# Patient Record
Sex: Male | Born: 1965
Health system: Southern US, Community
[De-identification: ages and names within clinical notes are randomized; demographics above are authoritative.]

## PROBLEM LIST (undated history)

## (undated) DIAGNOSIS — Q2381 Bicuspid aortic valve: Secondary | ICD-10-CM

## (undated) DIAGNOSIS — I10 Essential (primary) hypertension: Secondary | ICD-10-CM

## (undated) DIAGNOSIS — Q231 Congenital insufficiency of aortic valve: Secondary | ICD-10-CM

## (undated) DIAGNOSIS — I77810 Thoracic aortic ectasia: Secondary | ICD-10-CM

## (undated) HISTORY — DX: Bicuspid aortic valve: Q23.81

## (undated) HISTORY — DX: Thoracic aortic ectasia: I77.810

## (undated) HISTORY — DX: Essential (primary) hypertension: I10

## (undated) HISTORY — DX: Congenital insufficiency of aortic valve: Q23.1

---

## 2016-10-28 DIAGNOSIS — Z0001 Encounter for general adult medical examination with abnormal findings: Secondary | ICD-10-CM | POA: Diagnosis not present

## 2016-10-28 DIAGNOSIS — Z8042 Family history of malignant neoplasm of prostate: Secondary | ICD-10-CM | POA: Diagnosis not present

## 2016-10-28 DIAGNOSIS — I10 Essential (primary) hypertension: Secondary | ICD-10-CM | POA: Diagnosis not present

## 2016-10-29 ENCOUNTER — Other Ambulatory Visit: Payer: Self-pay | Admitting: Family Medicine

## 2016-10-29 ENCOUNTER — Telehealth (HOSPITAL_COMMUNITY): Payer: Self-pay | Admitting: Family Medicine

## 2016-10-29 DIAGNOSIS — Z8279 Family history of other congenital malformations, deformations and chromosomal abnormalities: Secondary | ICD-10-CM

## 2016-10-29 NOTE — Telephone Encounter (Signed)
10/29/2016 09:47 AM Phone ([place]) Lee Grant (Self) [phone] (H)   Left Message - Called pt and lmsg for him to CB to get sch for and echo.    By Verdene Rio

## 2016-11-12 ENCOUNTER — Ambulatory Visit (HOSPITAL_COMMUNITY): Payer: 59 | Attending: Cardiology

## 2016-11-12 ENCOUNTER — Other Ambulatory Visit: Payer: Self-pay

## 2016-11-12 DIAGNOSIS — Z8279 Family history of other congenital malformations, deformations and chromosomal abnormalities: Secondary | ICD-10-CM | POA: Diagnosis not present

## 2016-11-12 DIAGNOSIS — I77819 Aortic ectasia, unspecified site: Secondary | ICD-10-CM | POA: Diagnosis not present

## 2016-11-12 DIAGNOSIS — Q231 Congenital insufficiency of aortic valve: Secondary | ICD-10-CM | POA: Insufficient documentation

## 2016-12-14 DIAGNOSIS — I10 Essential (primary) hypertension: Secondary | ICD-10-CM | POA: Diagnosis not present

## 2016-12-14 DIAGNOSIS — Q231 Congenital insufficiency of aortic valve: Secondary | ICD-10-CM | POA: Diagnosis not present

## 2016-12-15 ENCOUNTER — Telehealth: Payer: Self-pay

## 2016-12-15 NOTE — Telephone Encounter (Signed)
SENT NOTES TO SCHEDULING 

## 2017-01-11 ENCOUNTER — Encounter: Payer: Self-pay | Admitting: *Deleted

## 2017-01-25 DIAGNOSIS — I77810 Thoracic aortic ectasia: Secondary | ICD-10-CM | POA: Insufficient documentation

## 2017-01-25 DIAGNOSIS — Q231 Congenital insufficiency of aortic valve: Secondary | ICD-10-CM | POA: Insufficient documentation

## 2017-01-25 NOTE — Progress Notes (Signed)
**Note De-Identified Grant Obfuscation** Cardiology Office Note    Date:  01/26/2017   ID:  Lee Grant, DOB [birth year], MRN [id number]  PCP:  Lee Kid, MD  Cardiologist: Lee Grooms, MD   Chief Complaint  Patient presents with  . Advice Only    Bicuspid aortic valve    History of Present Illness:  Lee Grant is a 51 y.o. male referred by Lee Grant for evaluation and management of bicuspid aortic valve.  The patient's older brother who is asymptomatic was recently diagnosed with bicuspid aortic valve. This led to an echocardiogram which identified bicuspid aortic valve and [name]. He denies chest pain, orthopnea, dyspnea on exertion, syncope, and exertional fatigue. He is relatively sedentary. He has no difficulty performing his typical activities.  Past Medical History:  Diagnosis Date  . Bicuspid aortic valve   . Hypertension     No past surgical history on file.  Current Medications: No outpatient prescriptions prior to visit.   No facility-administered medications prior to visit.      Allergies:   Patient has no known allergies.   Social History   Social History  . Marital status: Married    Spouse name: N/A  . Number of children: N/A  . Years of education: N/A   Social History Main Topics  . Smoking status: Former Research scientist (life sciences)  . Smokeless tobacco: Never Used  . Alcohol use None  . Drug use: Unknown  . Sexual activity: Not Asked   Other Topics Concern  . None   Social History Narrative  . None     Family History:  The patient's family history includes Aneurysm in his father and mother; Healthy in his sister; Hypotension in his mother; Other in his brother. Family history of bicuspid aortic valve, brother.  ROS:   Please see the history of present illness.    Snores at night. Previously observed by his wife. History of hypertension.  All other systems reviewed and are negative.   PHYSICAL EXAM:   VS:  BP 126/90 (BP Location: Right Arm)   Pulse 66    Ht 5\' 10"  (1.778 m)   Wt 224 lb (101.6 kg)   BMI 32.14 kg/m    GEN: Well nourished, well developed, in no acute distress  HEENT: normal  Neck: no JVD, carotid bruits, or masses Cardiac: RRR; Relatively low pitched 1/6 systolic murmur at right upper sternal border. No rubs, gallops, or edema.  Respiratory:  clear to auscultation bilaterally, normal work of breathing GI: soft, nontender, nondistended, + BS MS: no deformity or atrophy  Skin: warm and dry, no rash Neuro:  Alert and Oriented x 3, Strength and sensation are intact Psych: euthymic mood, full affect  Wt Readings from Last 3 Encounters:  01/26/17 224 lb (101.6 kg)      Studies/Labs Reviewed:   EKG:  EKG  Normal sinus rhythm with normal EKG tracing.  Recent Labs: No results found for requested labs within last 8760 hours.   Lipid Panel No results found for: CHOL, TRIG, HDL, CHOLHDL, VLDL, LDLCALC, LDLDIRECT  Additional studies/ records that were reviewed today include:   Echocardiogram May 2018: Study Conclusions  - Left ventricle: The cavity size was normal. There was mild focal   basal hypertrophy of the septum. Systolic function was vigorous.   The estimated ejection fraction was in the range of 65% to 70%.   Wall motion was normal; there were no regional wall motion   abnormalities. Left ventricular diastolic function parameters  were normal. - Aortic valve: Bicuspid; mildly thickened leaflets. Peak gradient   (S): 21 mm Hg. - Ascending aorta: The ascending aorta was mildly dilated measuring   42 mm. - Mitral valve: There was mild regurgitation. - Left atrium: The atrium was normal in size. - Right ventricle: Systolic function was normal. - Tricuspid valve: There was mild regurgitation. - Pulmonic valve: There was mild regurgitation. - Pulmonary arteries: Systolic pressure was within the normal   range. - Inferior vena cava: The vessel was normal in size. - Pericardium, extracardiac: There was no  pericardial effusion.  Impressions:  - Hyperdynamic LVEF 65-7-%.   Bicuspid aortic valve with mildly elevated transaortic gradients.   No AI.   Mildly dilated ascending aorta measuring 42 mm.   ASSESSMENT:    1. Bicuspid aortic valve   2. Dilated aortic root ([place])   3. Essential hypertension   4. Snoring      PLAN:  In order of problems listed above:  1. Essentially asymptomatic. Very slight systolic murmur is audible. Recently started on losartan 50 mg per day. In the future we should consider switching over to beta blocker therapy given the mild aortic root enlargement noted on the recent echo. This can be done next year. 2. Please see disc course above. Transition to beta blocker therapy for blood pressure control would be preferable to control/protect against root expansion. 3. Good blood pressure control today. Consider switch over to beta blocker therapy as tolerated by heart rate and side effects. 4. May need sleep apnea evaluation.  Greater than 50% of the time was spent in counseling and discussion of the natural history of bicuspid aortic valve, endocarditis prophylaxis, implications of rapid root expansion, and at least yearly follow-up.  We'll perform a 2-D Doppler echocardiogram in one year to establish stability.  May need to have evaluation for sleep apnea.  Medication Adjustments/Labs and Tests Ordered: Current medicines are reviewed at length with the patient today.  Concerns regarding medicines are outlined above.  Medication changes, Labs and Tests ordered today are listed in the Patient Instructions below. Patient Instructions  Medication Instructions:  None  Labwork: None  Testing/Procedures: Your physician has requested that you have an echocardiogram a few weeks prior to your 1 year follow up. Echocardiography is a painless test that uses sound waves to create images of your heart. It provides your doctor with information about the size and shape of  your heart and how well your heart's chambers and valves are working. This procedure takes approximately one hour. There are no restrictions for this procedure.    Follow-Up: Your physician wants you to follow-up in: 1 year with Dr. Tamala Julian.  You will receive a reminder letter in the mail two months in advance. If you don't receive a letter, please call our office to schedule the follow-up appointment.   Any Other Special Instructions Will Be Listed Below (If Applicable).     If you need a refill on your cardiac medications before your next appointment, please call your pharmacy.      Signed, Lee Grooms, MD  01/26/2017 1:23 PM    [facility] [address], [place], [place]  [address] Phone: [phone]; Fax: [phone]

## 2017-01-26 ENCOUNTER — Encounter (INDEPENDENT_AMBULATORY_CARE_PROVIDER_SITE_OTHER): Payer: Self-pay

## 2017-01-26 ENCOUNTER — Encounter: Payer: Self-pay | Admitting: Interventional Cardiology

## 2017-01-26 ENCOUNTER — Ambulatory Visit (INDEPENDENT_AMBULATORY_CARE_PROVIDER_SITE_OTHER): Payer: 59 | Admitting: Interventional Cardiology

## 2017-01-26 VITALS — BP 126/90 | HR 66 | Ht 70.0 in | Wt 224.0 lb

## 2017-01-26 DIAGNOSIS — Q231 Congenital insufficiency of aortic valve: Secondary | ICD-10-CM | POA: Diagnosis not present

## 2017-01-26 DIAGNOSIS — I77810 Thoracic aortic ectasia: Secondary | ICD-10-CM | POA: Diagnosis not present

## 2017-01-26 DIAGNOSIS — I10 Essential (primary) hypertension: Secondary | ICD-10-CM

## 2017-01-26 DIAGNOSIS — R0683 Snoring: Secondary | ICD-10-CM | POA: Diagnosis not present

## 2017-01-26 NOTE — Patient Instructions (Signed)
Medication Instructions:  None  Labwork: None  Testing/Procedures: Your physician has requested that you have an echocardiogram a few weeks prior to your 1 year follow up. Echocardiography is a painless test that uses sound waves to create images of your heart. It provides your doctor with information about the size and shape of your heart and how well your heart's chambers and valves are working. This procedure takes approximately one hour. There are no restrictions for this procedure.    Follow-Up: Your physician wants you to follow-up in: 1 year with Dr. Tamala Julian.  You will receive a reminder letter in the mail two months in advance. If you don't receive a letter, please call our office to schedule the follow-up appointment.   Any Other Special Instructions Will Be Listed Below (If Applicable).     If you need a refill on your cardiac medications before your next appointment, please call your pharmacy.

## 2017-11-01 DIAGNOSIS — Z8042 Family history of malignant neoplasm of prostate: Secondary | ICD-10-CM | POA: Diagnosis not present

## 2017-11-01 DIAGNOSIS — Z0000 Encounter for general adult medical examination without abnormal findings: Secondary | ICD-10-CM | POA: Diagnosis not present

## 2017-11-01 DIAGNOSIS — I10 Essential (primary) hypertension: Secondary | ICD-10-CM | POA: Diagnosis not present

## 2017-12-12 ENCOUNTER — Other Ambulatory Visit: Payer: Self-pay

## 2017-12-12 ENCOUNTER — Ambulatory Visit (HOSPITAL_COMMUNITY): Payer: 59 | Attending: Cardiovascular Disease

## 2017-12-12 DIAGNOSIS — Q2381 Bicuspid aortic valve: Secondary | ICD-10-CM

## 2017-12-12 DIAGNOSIS — Q231 Congenital insufficiency of aortic valve: Secondary | ICD-10-CM | POA: Diagnosis not present

## 2017-12-27 DIAGNOSIS — D126 Benign neoplasm of colon, unspecified: Secondary | ICD-10-CM | POA: Diagnosis not present

## 2017-12-27 DIAGNOSIS — K621 Rectal polyp: Secondary | ICD-10-CM | POA: Diagnosis not present

## 2017-12-27 DIAGNOSIS — Z1211 Encounter for screening for malignant neoplasm of colon: Secondary | ICD-10-CM | POA: Diagnosis not present

## 2018-01-23 ENCOUNTER — Other Ambulatory Visit (HOSPITAL_COMMUNITY): Payer: 59

## 2018-02-24 ENCOUNTER — Ambulatory Visit (INDEPENDENT_AMBULATORY_CARE_PROVIDER_SITE_OTHER): Payer: 59 | Admitting: Interventional Cardiology

## 2018-02-24 ENCOUNTER — Encounter: Payer: Self-pay | Admitting: Interventional Cardiology

## 2018-02-24 VITALS — BP 112/86 | HR 60 | Ht 69.5 in | Wt 229.2 lb

## 2018-02-24 DIAGNOSIS — R0683 Snoring: Secondary | ICD-10-CM

## 2018-02-24 DIAGNOSIS — I10 Essential (primary) hypertension: Secondary | ICD-10-CM | POA: Diagnosis not present

## 2018-02-24 DIAGNOSIS — I77810 Thoracic aortic ectasia: Secondary | ICD-10-CM | POA: Diagnosis not present

## 2018-02-24 DIAGNOSIS — Q231 Congenital insufficiency of aortic valve: Secondary | ICD-10-CM | POA: Diagnosis not present

## 2018-02-24 NOTE — Progress Notes (Signed)
Cardiology Office Note:    Date:  02/24/2018   ID:  Lee Grant, DOB Sep 27, 1965, MRN [id number]  PCP:  Lee Kid, MD  Cardiologist:  No primary care provider on file.   Referring MD: Lee Kid, MD   Chief Complaint  Patient presents with  . Thoracic Aortic Aneurysm  . Cardiac Valve Problem    History of Present Illness:    Lee Grant is a 52 y.o. male with a hx of essential hypertension, dilated aortic root, bicuspid aortic valve, with suspicion of bicuspid valve related aortopathy.  Asymptomatic, fully active, and no medication side effects.  His brother has a bicuspid aortic valve and aortopathy.  The brother is 41 years older.  Past Medical History:  Diagnosis Date  . Bicuspid aortic valve   . Hypertension     History reviewed. No pertinent surgical history.  Current Medications: Current Meds  Medication Sig  . losartan (COZAAR) 50 MG tablet Take 50 mg by mouth daily.     Allergies:   Patient has no known allergies.   Social History   Socioeconomic History  . Marital status: Married    Spouse name: Not on file  . Number of children: Not on file  . Years of education: Not on file  . Highest education level: Not on file  Occupational History  . Not on file  Social Needs  . Financial resource strain: Not on file  . Food insecurity:    Worry: Not on file    Inability: Not on file  . Transportation needs:    Medical: Not on file    Non-medical: Not on file  Tobacco Use  . Smoking status: Former Research scientist (life sciences)  . Smokeless tobacco: Never Used  Substance and Sexual Activity  . Alcohol use: Not on file  . Drug use: Not on file  . Sexual activity: Not on file  Lifestyle  . Physical activity:    Days per week: Not on file    Minutes per session: Not on file  . Stress: Not on file  Relationships  . Social connections:    Talks on phone: Not on file    Gets together: Not on file    Attends religious service: Not on file    Active member of club or  organization: Not on file    Attends meetings of clubs or organizations: Not on file    Relationship status: Not on file  Other Topics Concern  . Not on file  Social History Narrative  . Not on file     Family History: The patient's family history includes Aneurysm in his father and mother; Healthy in his sister; Hypotension in his mother; Other in his brother.  ROS:   Please see the history of present illness.    No All other systems reviewed and are negative.  EKGs/Labs/Other Studies Reviewed:    The following studies were reviewed today: 2D Doppler echocardiogram performed June 2019: Study Conclusions  - Left ventricle: The cavity size was normal. Systolic function was   normal. The estimated ejection fraction was in the range of 60%   to 65%. Wall motion was normal; there were no regional wall   motion abnormalities. - Aortic valve: A bicuspid morphology cannot be excluded;   moderately thickened, mildly calcified leaflets. There was mild   stenosis. - Left atrium: The atrium was mildly dilated. - Atrial septum: No defect or patent foramen ovale was identified. -Ascending aortic diameter 4.4 cm.  EKG:  EKG is  ordered today.  The ekg ordered today demonstrates this rhythm with normal appearance.  Poor R wave progression V1 to V2.  Recent Labs: No results found for requested labs within last 8760 hours.  Recent Lipid Panel No results found for: CHOL, TRIG, HDL, CHOLHDL, VLDL, LDLCALC, LDLDIRECT  Physical Exam:    VS:  BP 112/86   Pulse 60   Ht 5' 9.5" (1.765 m)   Wt 229 lb 3.2 oz (104 kg)   BMI 33.36 kg/m     Wt Readings from Last 3 Encounters:  02/24/18 229 lb 3.2 oz (104 kg)  01/26/17 224 lb (101.6 kg)     GEN: Mildly obese well developed in no acute distress HEENT: Normal NECK: No JVD. LYMPHATICS: No lymphadenopathy CARDIAC: RRR, over 6 systolic right upper sternal border aortic valve murmur, no gallop, no edema. VASCULAR: 2+ and symmetric radial and  posterior tibial pulses.  No bruits. RESPIRATORY:  Clear to auscultation without rales, wheezing or rhonchi  ABDOMEN: Soft, non-tender, non-distended, No pulsatile mass, MUSCULOSKELETAL: No deformity  SKIN: Warm and dry NEUROLOGIC:  Alert and oriented x 3 PSYCHIATRIC:  Normal affect   ASSESSMENT:    1. Bicuspid aortic valve   2. Dilated aortic root ([place])   3. Essential hypertension   4. Snoring    PLAN:    In order of problems listed above:  1. Soft systolic murmur but no regurgitation is heard.  Most recent echo suggests mild stenosis.  No regurgitation.  Suspected bicuspid etiology. 2. Aortic diameter 4.4 cm.  Prior to next office visit in 1 year he will have a CT Angie oh of the thoracic aorta with contrast to correctly/accurately size the aorta. 3. Current excellent blood pressure control.  Only current therapy is losartan 50 mg/day.  Relatively slow heart rate.  Have not added beta-blocker therapy.  Will wait at least 1 more year until we see CT Angio results.  Clinical follow-up in 1 year.  Clinic follow-up in 1 year.   Medication Adjustments/Labs and Tests Ordered: Current medicines are reviewed at length with the patient today.  Concerns regarding medicines are outlined above.  No orders of the defined types were placed in this encounter.  No orders of the defined types were placed in this encounter.   There are no Patient Instructions on file for this visit.   Signed, Sinclair Grooms, MD  02/24/2018 11:34 AM    [name]

## 2018-02-24 NOTE — Patient Instructions (Addendum)
Medication Instructions:  Your physician recommends that you continue on your current medications as directed. Please refer to the Current Medication list given to you today.  Labwork: None  Testing/Procedures: Your physician recommends that your a CT Angio of the Aorta just prior to seeing Dr. Tamala Julian back in one year. (Should be in late July or early August)  Follow-Up: Your physician wants you to follow-up in: 1 year with Dr. Tamala Julian.  You will receive a reminder letter in the mail two months in advance. If you don't receive a letter, please call our office to schedule the follow-up appointment.   Any Other Special Instructions Will Be Listed Below (If Applicable).     If you need a refill on your cardiac medications before your next appointment, please call your pharmacy.

## 2018-12-25 ENCOUNTER — Other Ambulatory Visit: Payer: Self-pay | Admitting: *Deleted

## 2018-12-25 DIAGNOSIS — I10 Essential (primary) hypertension: Secondary | ICD-10-CM

## 2019-01-29 ENCOUNTER — Other Ambulatory Visit: Payer: 59

## 2019-01-30 ENCOUNTER — Other Ambulatory Visit: Payer: Self-pay

## 2019-01-30 ENCOUNTER — Other Ambulatory Visit: Payer: 59

## 2019-01-30 DIAGNOSIS — I10 Essential (primary) hypertension: Secondary | ICD-10-CM

## 2019-01-30 LAB — BASIC METABOLIC PANEL
BUN/Creatinine Ratio: 13 (ref 9–20)
BUN: 13 mg/dL (ref 6–24)
CO2: 22 mmol/L (ref 20–29)
Calcium: 9.4 mg/dL (ref 8.7–10.2)
Chloride: 101 mmol/L (ref 96–106)
Creatinine, Ser: 1.01 mg/dL (ref 0.76–1.27)
GFR calc Af Amer: 99 mL/min/{1.73_m2} (ref >59)
GFR calc non Af Amer: 86 mL/min/{1.73_m2} (ref >59)
Glucose: 89 mg/dL (ref 65–99)
Potassium: 4.5 mmol/L (ref 3.5–5.2)
Sodium: 138 mmol/L (ref 134–144)

## 2019-02-02 ENCOUNTER — Telehealth: Payer: Self-pay | Admitting: Interventional Cardiology

## 2019-02-02 NOTE — Telephone Encounter (Signed)
Script Screening patients for COVID-19 and reviewing new operational procedures  Greeting - The reason I am calling is to share with you some new changes to our processes that are designed to help [place] keep everyone safe. Is now a good time to speak with you? Patient says "no' - ask them when you can call back and let them know it's important to do this prior to their appointment.  Patient says "yes" - Great, [place]) the first thing I need to do is ask you some screening Questions.  1. To the best of your knowledge, have you been in close contact with any one with a confirmed diagnosis of COVID 19? o No - proceed to next question  2. Have you had any one or more of the following: fever, chills, cough, shortness of breath or any flu-like symptoms? o No - proceed to next question  3. Have you been diagnosed with or have a previous diagnosis of COVID 19? o No - proceed to next question  4. I am going to go over a few other symptoms with you. Please let me know if you are experiencing any of the following: . Ear, nose or throat discomfort . A sore throat . Headache . Muscle pain . Diarrhea . Loss of taste or smell o No - proceed to next question  Thank you for answering these questions. Please know we will ask you these questions or similar questions when you arrive for your appointment and again it's how we are keeping everyone safe. Also, to keep you safe, please use the provided hand sanitizer when you enter the building. (Insert pt name), we are asking everyone in the building to wear a mask because they help [place] prevent the spread of germs. Do you have a mask of your own, if not, we are happy to provide one for you. The last thing I want to go over with you is the no visitor guidelines. This means no one can attend the appointment with you unless you need physical assistance. I understand this may be different from your past appointments and I know this may be difficult but  please know if someone is driving you we are happy to call them for you once your appointment is over.  [INSERT [place]  (Insert pt name) I've given you a lot of information, what questions do you have about what I've talked about today or your appointment tomorrow? Benjie Karvonen, CMA

## 2019-02-05 ENCOUNTER — Other Ambulatory Visit: Payer: Self-pay

## 2019-02-05 ENCOUNTER — Ambulatory Visit (INDEPENDENT_AMBULATORY_CARE_PROVIDER_SITE_OTHER)
Admission: RE | Admit: 2019-02-05 | Discharge: 2019-02-05 | Disposition: A | Discharge status: Home / Self Care | Payer: 59 | Source: Ambulatory Visit | Attending: Interventional Cardiology | Admitting: Interventional Cardiology

## 2019-02-05 DIAGNOSIS — I77810 Thoracic aortic ectasia: Secondary | ICD-10-CM | POA: Diagnosis not present

## 2019-02-05 DIAGNOSIS — Q231 Congenital insufficiency of aortic valve: Secondary | ICD-10-CM

## 2019-02-05 MED ORDER — IOHEXOL 350 MG/ML SOLN
100.0000 mL | Freq: Once | INTRAVENOUS | Status: AC | PRN
  Administered 2019-02-05: 100 mL via INTRAVENOUS

## 2019-04-22 NOTE — Progress Notes (Signed)
Cardiology Office Note:    Date:  04/24/2019   ID:  Oretha Milch, DOB 1966/06/25, MRN [id number]  PCP:  Kristen Loader, FNP  Cardiologist:  Sinclair Grooms, MD   Referring MD: Kristen Loader, FNP   Chief Complaint  Patient presents with  . Thoracic Aortic Aneurysm  . Cardiac Valve Problem    History of Present Illness:    Lee Grant is a 53 y.o. male with a hx of essential hypertension, dilated aortic root, bicuspid aortic valve, with suspicion of bicuspid valve related aortopathy.  Jahlani has no symptoms.  No medication side effects.  Physically and active.  Has gained some weight.  Denies chest pain.  Past Medical History:  Diagnosis Date  . Bicuspid aortic valve   . Hypertension     History reviewed. No pertinent surgical history.  Current Medications: Current Meds  Medication Sig  . losartan (COZAAR) 50 MG tablet Take 50 mg by mouth daily.     Allergies:   Patient has no known allergies.   Social History   Socioeconomic History  . Marital status: Married    Spouse name: Not on file  . Number of children: Not on file  . Years of education: Not on file  . Highest education level: Not on file  Occupational History  . Not on file  Social Needs  . Financial resource strain: Not on file  . Food insecurity    Worry: Not on file    Inability: Not on file  . Transportation needs    Medical: Not on file    Non-medical: Not on file  Tobacco Use  . Smoking status: Former Research scientist (life sciences)  . Smokeless tobacco: Never Used  Substance and Sexual Activity  . Alcohol use: Not on file  . Drug use: Not on file  . Sexual activity: Not on file  Lifestyle  . Physical activity    Days per week: Not on file    Minutes per session: Not on file  . Stress: Not on file  Relationships  . Social Herbalist on phone: Not on file    Gets together: Not on file    Attends religious service: Not on file    Active member of club or organization: Not on  file    Attends meetings of clubs or organizations: Not on file    Relationship status: Not on file  Other Topics Concern  . Not on file  Social History Narrative  . Not on file     Family History: The patient's family history includes Aneurysm in his father and mother; Healthy in his sister; Hypotension in his mother; Other in his brother.  ROS:   Please see the history of present illness.    No complaints all other systems reviewed and are negative.  EKGs/Labs/Other Studies Reviewed:    The following studies were reviewed today: ECHOCARDIOGRAM 2019: Study Conclusions  - Left ventricle: The cavity size was normal. Systolic function was   normal. The estimated ejection fraction was in the range of 60%   to 65%. Wall motion was normal; there were no regional wall   motion abnormalities. - Aortic valve: A bicuspid morphology cannot be excluded;   moderately thickened, mildly calcified leaflets. There was mild   stenosis. - Left atrium: The atrium was mildly dilated. - Atrial septum: No defect or patent foramen ovale was identified.  CT scan performed February 05, 2019:  IMPRESSION: 1. Ascending thoracic aortic diameter measures 4.4  x 4.4 cm. No evident dissection. Recommend annual imaging followup by CTA or MRA. This recommendation follows 2010 status for CT ACCF/AHA/AATS/ACR/ASA/SCA/SCAI/SIR/STS/SVM Guidelines for the Diagnosis and Management of Patients with Thoracic Aortic Disease. Circulation. 2010; [id number]. Aortic aneurysm NOS (ICD10-I71.9).  2.  No pulmonary embolus evident.  3.  No lung edema or consolidation.  4.  No demonstrable adenopathy.  5.  Hepatic steatosis.   EKG:  EKG demonstrates normal sinus rhythm, QS pattern V1 through V3, but is otherwise normal.  When compared to the prior tracing from February 24, 2018, no changes occurred.  Recent Labs: 01/30/2019: BUN 13; Creatinine, Ser 1.01; Potassium 4.5; Sodium 138  Recent Lipid Panel No  results found for: CHOL, TRIG, HDL, CHOLHDL, VLDL, LDLCALC, LDLDIRECT  Physical Exam:    VS:  BP (!) 149/96   Pulse 73   Ht 5' 9.5" (1.765 m)   Wt 235 lb (106.6 kg)   SpO2 98%   BMI 34.21 kg/m     Wt Readings from Last 3 Encounters:  04/24/19 235 lb (106.6 kg)  02/24/18 229 lb 3.2 oz (104 kg)  01/26/17 224 lb (101.6 kg)     GEN: Moderate obesity. No acute distress HEENT: Normal NECK: No JVD. LYMPHATICS: No lymphadenopathy CARDIAC:  RRR without murmur, gallop, or edema. VASCULAR:  Normal Pulses. No bruits. RESPIRATORY:  Clear to auscultation without rales, wheezing or rhonchi  ABDOMEN: Soft, non-tender, non-distended, No pulsatile mass, MUSCULOSKELETAL: No deformity  SKIN: Warm and dry NEUROLOGIC:  Alert and oriented x 3 PSYCHIATRIC:  Normal affect   ASSESSMENT:    1. Bicuspid aortic valve   2. Essential hypertension   3. Dilated aortic root ([place])   4. Educated about COVID-19 virus infection    PLAN:    In order of problems listed above:  1. No aortic regurgitation or systolic murmurs heard 2. Poor blood pressure control.  Target is 130/80 mmHg or less.  Decrease salt in diet, avoid nonsteroidals and alcohol.  Start Toprol-XL 25 mg/day.  Return in 1 month for follow-up and uptitrate losartan to losartan HCT if needed an alternative would be to further uptitrate beta-blocker. 3. Aortic aneurysm 4.4 x 4.4 cm.  Comanagement with CTS currently. 4. Watching, wearing, and waiting is advocated and endorsed by the patient.  Return in 1 month for blood pressure follow-up.  Target 130/80 mmHg.  Both losartan and beta-blocker therapy are preventive with reference to aortic aneurysm expansion.  Clinical follow-up with me in 1 year.   Medication Adjustments/Labs and Tests Ordered: Current medicines are reviewed at length with the patient today.  Concerns regarding medicines are outlined above.  No orders of the defined types were placed in this encounter.  No orders of the  defined types were placed in this encounter.   There are no Patient Instructions on file for this visit.   Signed, Sinclair Grooms, MD  04/24/2019 9:10 AM    Lee Grant

## 2019-04-24 ENCOUNTER — Encounter: Payer: Self-pay | Admitting: Interventional Cardiology

## 2019-04-24 ENCOUNTER — Ambulatory Visit (INDEPENDENT_AMBULATORY_CARE_PROVIDER_SITE_OTHER): Payer: 59 | Admitting: Interventional Cardiology

## 2019-04-24 ENCOUNTER — Other Ambulatory Visit: Payer: Self-pay

## 2019-04-24 VITALS — BP 149/96 | HR 73 | Ht 69.5 in | Wt 235.0 lb

## 2019-04-24 DIAGNOSIS — I77810 Thoracic aortic ectasia: Secondary | ICD-10-CM | POA: Diagnosis not present

## 2019-04-24 DIAGNOSIS — I10 Essential (primary) hypertension: Secondary | ICD-10-CM | POA: Diagnosis not present

## 2019-04-24 DIAGNOSIS — Z7189 Other specified counseling: Secondary | ICD-10-CM | POA: Diagnosis not present

## 2019-04-24 DIAGNOSIS — Q231 Congenital insufficiency of aortic valve: Secondary | ICD-10-CM | POA: Diagnosis not present

## 2019-04-24 MED ORDER — METOPROLOL SUCCINATE ER 25 MG PO TB24: 25.0000 mg | ORAL_TABLET | Freq: Every day | ORAL | 3 refills | Status: DC

## 2019-04-24 NOTE — Patient Instructions (Signed)
Medication Instructions:  1) START Metoprolol Succinate 25mg  once daily  If you need a refill on your cardiac medications before your next appointment, please call your pharmacy.   Lab work: None If you have labs (blood work) drawn today and your tests are completely normal, you will receive your results only by: [name] MyChart Message (if you have MyChart) OR . A paper copy in the mail If you have any lab test that is abnormal or we need to change your treatment, we will call you to review the results.  Testing/Procedures: None  Follow-Up:  Your physician recommends that you schedule a follow-up appointment in: 1 month with a PA or NP.   At [facility], you and your health needs are our priority.  As part of our continuing mission to provide you with exceptional heart care, we have created designated Provider Care Teams.  These Care Teams include your primary Cardiologist (physician) and Advanced Practice Providers (APPs -  Physician Assistants and Nurse Practitioners) who all work together to provide you with the care you need, when you need it. You will need a follow up appointment in 12 months.  Please call our office 2 months in advance to schedule this appointment.  You may see Sinclair Grooms, MD or one of the following Advanced Practice Providers on your designated Care Team:   Truitt Merle, NP Cecilie Kicks, NP . Kathyrn Drown, NP  Any Other Special Instructions Will Be Listed Below (If Applicable).  You have been referred to TCTS (thoracic surgeon).   Your goal blood pressure is 130/80 or less.

## 2019-05-17 ENCOUNTER — Encounter: Payer: 59 | Admitting: Cardiothoracic Surgery

## 2019-05-22 NOTE — Progress Notes (Signed)
Cardiology Office Note   Date:  05/23/2019   ID:  Lee Grant, DOB 1965-10-21, MRN [id number]  PCP:  Kristen Loader, FNP  Cardiologist:  Dr Tamala Julian    Chief Complaint  Patient presents with  . Hypertension      History of Present Illness: Lee Grant is a 53 y.o. male who presents for HTN.  He has a hx of essential hypertension, dilated aortic root now at 4.4 X 4.4 cm, bicuspid aortic valve, with suspicion of bicuspid valve related aortopathy.  Pt to see Dr. Servando Snare tomorrow.   Last visit with uncontrolled HTN toprol XL 25 mg here for follow up uptitrate losartan hct  Target 130/80   Today he is doing well, no chest pain or SOB.  His BP is improved but not at target.  He exercises but no wt lifting more aerobic. He tries to eat healthy, discussed salt and role in BP.    Past Medical History:  Diagnosis Date  . Bicuspid aortic valve   . Hypertension     History reviewed. No pertinent surgical history.   Current Outpatient Medications  Medication Sig Dispense Refill  . losartan (COZAAR) 50 MG tablet Take 50 mg by mouth daily.    . metoprolol succinate (TOPROL XL) 25 MG 24 hr tablet Take 1 tablet (25 mg total) by mouth daily. 90 tablet 3   No current facility-administered medications for this visit.     Allergies:   Patient has no known allergies.    Social History:  The patient  reports that he has quit smoking. He has never used smokeless tobacco.   Family History:  The patient's family history includes Aneurysm in his father and mother; Healthy in his sister; Hypotension in his mother; Other in his brother.    ROS:  General:no colds or fevers, no weight changes Skin:no rashes or ulcers HEENT:no blurred vision, no congestion CV:see HPI PUL:see HPI GI:no diarrhea constipation or melena, no indigestion GU:no hematuria, no dysuria MS:no joint pain, no claudication Neuro:no syncope, no lightheadedness Endo:no diabetes, no thyroid disease  Wt  Readings from Last 3 Encounters:  05/23/19 234 lb (106.1 kg)  04/24/19 235 lb (106.6 kg)  02/24/18 229 lb 3.2 oz (104 kg)     PHYSICAL EXAM: VS:  BP 136/86   Pulse 66   Ht 5' 9.5" (1.765 m)   Wt 234 lb (106.1 kg)   SpO2 97%   BMI 34.06 kg/m  , BMI Body mass index is 34.06 kg/m. General:Pleasant affect, NAD Skin:Warm and dry, brisk capillary refill HEENT:normocephalic, sclera clear, mucus membranes moist Neck:supple, no JVD, no bruits  Heart:S1S2 RRR without murmur, gallup, rub or click Lungs:clear without rales, rhonchi, or wheezes [id number], non tender, + BS, do not palpate liver spleen or masses Ext:no lower ext edema, 2+ pedal pulses, 2+ radial pulses Neuro:alert and oriented, MAE, follows commands, + facial symmetry    EKG:  EKG is NOT ordered today.    Recent Labs: 01/30/2019: BUN 13; Creatinine, Ser 1.01; Potassium 4.5; Sodium 138    Lipid Panel No results found for: CHOL, TRIG, HDL, CHOLHDL, VLDL, LDLCALC, LDLDIRECT     Other studies Reviewed: Additional studies/ records that were reviewed today include:. Echo 12/12/17 Study Conclusions  - Left ventricle: The cavity size was normal. Systolic function was   normal. The estimated ejection fraction was in the range of 60%   to 65%. Wall motion was normal; there were no regional wall   motion abnormalities. - Aortic valve:  A bicuspid morphology cannot be excluded;   moderately thickened, mildly calcified leaflets. There was mild   stenosis. - Left atrium: The atrium was mildly dilated. - Atrial septum: No defect or patent foramen ovale was identified.   ASSESSMENT AND PLAN:  1.  HTN increase losartan by adding 12.5 of hctz check BMP in 7-10 days and BP recheck in 2 months.  Follow up with Dr. Tamala Julian in 1 year and as needed.   2.  Bicuspid aortic valve and dilated aortic root.  To see Dr. Servando Snare tomorrow.      Current medicines are reviewed with the patient today.  The patient Has no concerns regarding  medicines.  The following changes have been made:  See above Labs/ tests ordered today include:see above  Disposition:   FU:  see above  Signed, Cecilie Kicks, NP  05/23/2019 8:37 AM    [address], [address] [place], [place] Phone: [phone]; Fax: [phone]

## 2019-05-23 ENCOUNTER — Other Ambulatory Visit: Payer: Self-pay

## 2019-05-23 ENCOUNTER — Ambulatory Visit (INDEPENDENT_AMBULATORY_CARE_PROVIDER_SITE_OTHER): Payer: 59 | Admitting: Cardiology

## 2019-05-23 ENCOUNTER — Encounter: Payer: Self-pay | Admitting: Cardiology

## 2019-05-23 ENCOUNTER — Encounter (INDEPENDENT_AMBULATORY_CARE_PROVIDER_SITE_OTHER): Payer: Self-pay

## 2019-05-23 VITALS — BP 136/86 | HR 66 | Ht 69.5 in | Wt 234.0 lb

## 2019-05-23 DIAGNOSIS — Q231 Congenital insufficiency of aortic valve: Secondary | ICD-10-CM | POA: Diagnosis not present

## 2019-05-23 DIAGNOSIS — I77810 Thoracic aortic ectasia: Secondary | ICD-10-CM

## 2019-05-23 DIAGNOSIS — I10 Essential (primary) hypertension: Secondary | ICD-10-CM

## 2019-05-23 MED ORDER — LOSARTAN POTASSIUM-HCTZ 50-12.5 MG PO TABS: 1.0000 | ORAL_TABLET | Freq: Every day | ORAL | 3 refills | Status: DC

## 2019-05-23 NOTE — Patient Instructions (Addendum)
Medication Instructions:  1.) Continue taking your Metoprolol   2.) START: Losartan-Hydrochlorothiazide 50-12.5 mg once a day   *If you need a refill on your cardiac medications before your next appointment, please call your pharmacy*  Lab Work: FUTURE: BMET on 06/01/2019 (Our lab is open from 7:30 AM to 4:30 PM)   If you have labs (blood work) drawn today and your tests are completely normal, you will receive your results only by: [name] MyChart Message (if you have MyChart) OR . A paper copy in the mail If you have any lab test that is abnormal or we need to change your treatment, we will call you to review the results.  Testing/Procedures: None   Follow-Up: At [facility], you and your health needs are our priority.  As part of our continuing mission to provide you with exceptional heart care, we have created designated Provider Care Teams.  These Care Teams include your primary Cardiologist (physician) and Advanced Practice Providers (APPs -  Physician Assistants and Nurse Practitioners) who all work together to provide you with the care you need, when you need it.  Your next appointment:   12 months  The format for your next appointment:   In Person  Provider:   You may see Sinclair Grooms, MD or one of the following Advanced Practice Providers on your designated Care Team:    Truitt Merle, NP  Cecilie Kicks, NP  Kathyrn Drown, NP  Follow up with the hypertension clinic on 08/06/2018 @ 8:30 AM  Other Instructions

## 2019-05-24 ENCOUNTER — Encounter: Payer: Self-pay | Admitting: Cardiology

## 2019-05-24 ENCOUNTER — Encounter: Payer: Self-pay | Admitting: Cardiothoracic Surgery

## 2019-05-24 ENCOUNTER — Institutional Professional Consult (permissible substitution) (INDEPENDENT_AMBULATORY_CARE_PROVIDER_SITE_OTHER): Payer: 59 | Admitting: Cardiothoracic Surgery

## 2019-05-24 VITALS — BP 155/90 | HR 64 | Temp 98.1°F | Resp 20 | Ht 69.5 in | Wt 234.0 lb

## 2019-05-24 DIAGNOSIS — I7121 Aneurysm of the ascending aorta, without rupture: Secondary | ICD-10-CM

## 2019-05-24 DIAGNOSIS — I712 Thoracic aortic aneurysm, without rupture: Secondary | ICD-10-CM | POA: Diagnosis not present

## 2019-05-24 NOTE — Patient Instructions (Signed)

## 2019-05-24 NOTE — Progress Notes (Signed)
[address]       [place] [address]             [phone]                    Woodfin Pipkins [facility] Medical Record [id number] Date of Birth: Jun 11, 1966  Referring: Belva Crome, MD Primary Care: Kristen Loader, FNP Primary Cardiologist: Sinclair Grooms, MD  Chief Complaint:    Chief Complaint  Patient presents with  . Thoracic Aortic Aneurysm    Surgical eval, CTA Chest 02/05/19    History of Present Illness:    Lee Grant 53 y.o. male is seen in the office  today for evaluation of mildly dilated ascending aorta.  The patient notes that his brother was found to have a bicuspid aortic valve and dilated aorta and because of this the patient sought cardiology evaluation.  Echocardiogram confirmed bicuspid aortic valve.  In July 2020 the patient had a CTA of the chest.  The patient denies any anginal symptoms or symptoms of heart failure.  He notes that he is fairly sedentary and works as a Engineer, maintenance ([employer]).    Current Activity/ Functional Status:  Patient is independent with mobility/ambulation, transfers, ADL's, IADL's.   Zubrod Score: At the time of surgery this patient's most appropriate activity status/level should be described as: [x]     0    Normal activity, no symptoms []     1    Restricted in physical strenuous activity but ambulatory, able to do out light work []     2    Ambulatory and capable of self care, unable to do work activities, up and about               >50 % of waking hours                              []     3    Only limited self care, in bed greater than 50% of waking hours []     4    Completely disabled, no self care, confined to bed or chair []     5    Moribund   Past Medical History:  Diagnosis Date  . Aortic root dilatation ([place])   . Bicuspid aortic valve   . Hypertension     History reviewed. No pertinent surgical history.  Family History  Problem Relation Age of Onset  . Hypotension Mother   . Aneurysm Mother    . Aneurysm Father   . Healthy Sister   . Other Brother        bicuspid aortic valve   Patient's family history is significant for a brother with a mildly dilated ascending aorta and bicuspid valve being followed, 1 brother with normal aorta.  His father had an abdominal aortic aneurysm repaired by Dr. Donnetta Hutching.  His mother had an emergency aortic valve replacement and ascending aortic replacement when she had a perforated left ventricle at the time of cardiac catheterization evaluating aortic stenosis.   Social History   Tobacco Use  Smoking Status Former Smoker  Smokeless Tobacco Never Used    Social History   Substance and Sexual Activity  Alcohol Use None     Allergies  Allergen Reactions  . Quinolones     Patient was warned about not using Cipro and similar antibiotics. Recent studies have raised concern that fluoroquinolone antibiotics could  be associated with an increased risk of aortic aneurysm Fluoroquinolones have non-antimicrobial properties that might jeopardise the integrity of the extracellular matrix of the vascular wall In a  propensity score matched cohort study in [place], there was a 66% increased rate of aortic aneurysm or dissection associated with oral fluoroquinolone use, compared wit    Current Outpatient Medications  Medication Sig Dispense Refill  . losartan-hydrochlorothiazide (HYZAAR) 50-12.5 MG tablet Take 1 tablet by mouth daily. 90 tablet 3  . metoprolol succinate (TOPROL XL) 25 MG 24 hr tablet Take 1 tablet (25 mg total) by mouth daily. 90 tablet 3   No current facility-administered medications for this visit.      Review of Systems:     Cardiac Review of Systems: [Y] = yes  or   [ N ] = no   Chest Pain [   N ]  Resting SOB [  N ] Exertional SOB  [ N ]  Orthopnea Aqua.Slicker  ]   Pedal Edema [ N  ]    Palpitations [ N ] Syncope  [ N ]   Presyncope [ N  ]   General Review of Systems: [Y] = yes [  ]=no Constitional: recent weight change [  ];  Wt loss  over the last 3 months [   ] anorexia [  ]; fatigue [  ]; nausea [  ]; night sweats [  ]; fever [  ]; or chills [  ];           Eye : blurred vision [  ]; diplopia [   ]; vision changes [  ];  Amaurosis fugax[  ]; Resp: cough [  ];  wheezing[  ];  hemoptysis[  ]; shortness of breath[  ]; paroxysmal nocturnal dyspnea[  ]; dyspnea on exertion[  ]; or orthopnea[  ];  GI:  gallstones[  ], vomiting[  ];  dysphagia[  ]; melena[  ];  hematochezia [  ]; heartburn[  ];   Hx of  Colonoscopy[  ]; GU: kidney stones [  ]; hematuria[  ];   dysuria [  ];  nocturia[  ];  history of     obstruction [  ]; urinary frequency [  ]             Skin: rash, swelling[  ];, hair loss[  ];  peripheral edema[  ];  or itching[  ]; Musculosketetal: myalgias[  ];  joint swelling[  ];  joint erythema[  ];  joint pain[  ];  back pain[  ];  Heme/Lymph: bruising[  ];  bleeding[  ];  anemia[  ];  Neuro: TIA[  ];  headaches[  ];  stroke[  ];  vertigo[  ];  seizures[  ];   paresthesias[  ];  difficulty walking[  ];  Psych:depression[  ]; anxiety[  ];  Endocrine: diabetes[  ];  thyroid dysfunction[  ];  Immunizations: Flu up to date [ Y ]; Pneumococcal up to date [ Y ];  Other:     PHYSICAL EXAMINATION: BP (!) 155/90   Pulse 64   Temp 98.1 F (36.7 C) (Skin)   Resp 20   Ht 5' 9.5" (1.765 m)   Wt 106.1 kg   SpO2 94% Comment: RA  BMI 34.06 kg/m  General appearance: alert, appears stated age and no distress Head: Normocephalic, without obvious abnormality, atraumatic Neck: no adenopathy, no carotid bruit, no JVD, supple, symmetrical, trachea midline and thyroid not enlarged, symmetric, no tenderness/mass/nodules  Lymph nodes: Cervical, supraclavicular, and axillary nodes normal. Resp: clear to auscultation bilaterally Back: symmetric, no curvature. ROM normal. No CVA tenderness. Cardio: regular rate and rhythm, S1, S2 normal, 2/6 murmur of aortic insufficiency heard along left sternal border, click, rub or gallop GI: soft,  non-tender; bowel sounds normal; no masses,  no organomegaly, no palpable abdominal aneurysm appreciated Extremities: extremities normal, atraumatic, no cyanosis or edema, Homans sign is negative, no sign of DVT and No no palpable popliteal aneurysms are appreciated, palpable radial brachial DP and PT pulses bilaterally Neurologic: Grossly normal  Diagnostic Studies & Laboratory data:     Recent Radiology Findings:   CLINICAL DATA:  Ascending thoracic aortic dilatation. History of bicuspid aortic valve  EXAM: CT ANGIOGRAPHY CHEST WITH CONTRAST  TECHNIQUE: Multidetector CT imaging of the chest was performed using the standard protocol during bolus administration of intravenous contrast. Multiplanar CT image reconstructions and MIPs were obtained to evaluate the vascular anatomy.  CONTRAST:  125mL OMNIPAQUE IOHEXOL 350 MG/ML SOLN  COMPARISON:  None.  FINDINGS: Cardiovascular: Ascending thoracic aortic diameter measures 4.4 x 4.4 cm. At the level of the sinus of Valsalva, the measured transverse diameter measures 3.8 cm. At the sinotubular junction, the measured transverse diameter is measured at 3.2 cm. The measured diameter of the aorta at aortic arch measures 3.1 cm. The measured transverse diameter of the descending aorta at the main pulmonary outflow tract level measures 2.7 x 2.7 cm. No evident dissection. Visualized great vessels appear unremarkable. No evident pulmonary embolus. No pericardial effusion or pericardial thickening evident.  Mediastinum/Nodes: Visualized thyroid appears unremarkable. There is no evident thoracic adenopathy. No esophageal lesions are evident.  Lungs/Pleura: There is no edema or consolidation. No pleural effusion evident.  Upper Abdomen: There is hepatic steatosis. There are scattered subcentimeter cysts in the liver. Visualized upper abdominal structures otherwise appear unremarkable. A tiny splenule is noted incidentally anterior  to the spleen.  Musculoskeletal: There are no blastic or lytic bone lesions. No chest wall lesions evident.  Review of the MIP images confirms the above findings.  IMPRESSION: 1. Ascending thoracic aortic diameter measures 4.4 x 4.4 cm. No evident dissection. Recommend annual imaging followup by CTA or MRA. This recommendation follows 2010 ACCF/AHA/AATS/ACR/ASA/SCA/SCAI/SIR/STS/SVM Guidelines for the Diagnosis and Management of Patients with Thoracic Aortic Disease. Circulation. 2010; [id number]. Aortic aneurysm NOS (ICD10-I71.9).  2.  No pulmonary embolus evident.  3.  No lung edema or consolidation.  4.  No demonstrable adenopathy.  5.  Hepatic steatosis.   Electronically Signed   By: Lowella Grip III M.D.   On: 02/05/2019 10:27  I have independently reviewed the above radiology studies  and reviewed the findings with the patient.   Recent Lab Findings: Lab Results  Component Value Date   GLUCOSE 89 01/30/2019   NA 138 01/30/2019   K 4.5 01/30/2019   CL 101 01/30/2019   CREATININE 1.01 01/30/2019   BUN 13 01/30/2019   CO2 22 01/30/2019   ECHO 12/2017 Transthoracic Echocardiography  Patient:    Lafredrick, Islas MR #:       [id number] Study Date: 12/12/2017 Gender:     M Age:        89 Height:     177.8 cm Weight:     101.6 kg BSA:        2.27 m^2 Pt. Status: Room:   ATTENDING    Belva Crome, MD  Livia Snellen     Belva Crome, MD  REFERRING  Belva Crome, MD  SONOGRAPHER  Cindy Hazy, RDCS  PERFORMING   [facility], Outpatient  cc:  ------------------------------------------------------------------- LV EF: 60% -   65%  ------------------------------------------------------------------- Indications:      Q23.1 Bicuspid Aortic Valve.  ------------------------------------------------------------------- History:   PMH:  Acquired from the patient and from the patient&'s chart.  PMH:  Bicuspid Aortic Valve.  Risk factors:   Hypertension.   ------------------------------------------------------------------- Study Conclusions  - Left ventricle: The cavity size was normal. Systolic function was   normal. The estimated ejection fraction was in the range of 60%   to 65%. Wall motion was normal; there were no regional wall   motion abnormalities. - Aortic valve: A bicuspid morphology cannot be excluded;   moderately thickened, mildly calcified leaflets. There was mild   stenosis. - Left atrium: The atrium was mildly dilated. - Atrial septum: No defect or patent foramen ovale was identified.  ------------------------------------------------------------------- Study data:   Study status:  Routine.  Procedure:  The patient reported no pain pre or post test. Transthoracic echocardiography for left ventricular function evaluation, for right ventricular function evaluation, and for assessment of valvular function. Image quality was adequate.  Study completion:  There were no complications.          Transthoracic echocardiography.  M-mode, complete 2D, spectral Doppler, and color Doppler.  Birthdate: Patient birthdate: 12/05/1965.  Age:  Patient is 53 yr old.  Sex: Gender: male.    BMI: 32.1 kg/m^2.  Blood pressure:     126/90 Patient status:  Outpatient.  Study date:  Study date: 12/12/2017. Study time: 07:59 AM.  Location:  [facility]  -------------------------------------------------------------------  ------------------------------------------------------------------- Left ventricle:  The cavity size was normal. Systolic function was normal. The estimated ejection fraction was in the range of 60% to 65%. Wall motion was normal; there were no regional wall motion abnormalities.  ------------------------------------------------------------------- Aortic valve:   A bicuspid morphology cannot be excluded; moderately thickened, mildly calcified leaflets. Mobility was not restricted.  Doppler:    There was mild stenosis.   There was no regurgitation.    VTI ratio of LVOT to aortic valve: 0.52. Valve area (VTI): 1.99 cm^2. Indexed valve area (VTI): 0.88 cm^2/m^2. Peak velocity ratio of LVOT to aortic valve: 0.47. Valve area (Vmax): 1.78 cm^2. Indexed valve area (Vmax): 0.78 cm^2/m^2. Mean velocity ratio of LVOT to aortic valve: 0.51. Valve area (Vmean): 1.95 cm^2. Indexed valve area (Vmean): 0.86 cm^2/m^2.    Mean gradient (S): 12 mm Hg. Peak gradient (S): 23 mm Hg.  ------------------------------------------------------------------- Aorta:  The aorta was moderately dilated. Aortic root: The aortic root was normal in size.  ------------------------------------------------------------------- Mitral valve:   Mildly thickened leaflets . Mobility was not restricted.  Doppler:  Transvalvular velocity was within the normal range. There was no evidence for stenosis. There was trivial regurgitation.    Peak gradient (D): 2 mm Hg.  ------------------------------------------------------------------- Left atrium:  The atrium was mildly dilated.  ------------------------------------------------------------------- Atrial septum:  No defect or patent foramen ovale was identified.   ------------------------------------------------------------------- Right ventricle:  The cavity size was normal. Wall thickness was normal. Systolic function was normal.  ------------------------------------------------------------------- Pulmonic valve:    Doppler:  Transvalvular velocity was within the normal range. There was no evidence for stenosis. There was mild regurgitation.  ------------------------------------------------------------------- Tricuspid valve:   Structurally normal valve.    Doppler: Transvalvular velocity was within the normal range. There was mild regurgitation.  ------------------------------------------------------------------- Pulmonary artery:   The main pulmonary  artery was normal-sized. Systolic pressure  was within the normal range.  ------------------------------------------------------------------- Right atrium:  The atrium was normal in size.  ------------------------------------------------------------------- Pericardium:  There was no pericardial effusion.  ------------------------------------------------------------------- Systemic veins: Inferior vena cava: The vessel was normal in size. The respirophasic diameter changes were in the normal range (= 50%), consistent with normal central venous pressure.  ------------------------------------------------------------------- Post procedure conclusions Ascending Aorta:  - The aorta was moderately dilated.  ------------------------------------------------------------------- Measurements   Left ventricle                            Value          Reference  LV ID, ED, PLAX chordal                   48.9  mm       43 - 52  LV ID, ES, PLAX chordal                   25.4  mm       23 - 38  LV fx shortening, PLAX chordal            48    %        >=29  LV PW thickness, ED                       10.8  mm       ---------  IVS/LV PW ratio, ED                       0.99           <=1.3  Stroke volume, 2D                         94    ml       ---------  Stroke volume/bsa, 2D                     41    ml/m^2   ---------  LV e&', lateral                            11.6  cm/s     ---------  LV E/e&', lateral                          6.35           ---------  LV e&', medial                             11    cm/s     ---------  LV E/e&', medial                           6.7            ---------  LV e&', average                            11.3  cm/s     ---------  LV E/e&', average                          6.52           ---------  Ventricular septum                        Value          Reference  IVS thickness, ED                         10.7  mm       ---------    LVOT                                       Value          Reference  LVOT ID, S                                22    mm       ---------  LVOT area                                 3.8   cm^2     ---------  LVOT ID                                   22    mm       ---------  LVOT peak velocity, S                     112   cm/s     ---------  LVOT mean velocity, S                     83.7  cm/s     ---------  LVOT VTI, S                               24.7  cm       ---------  LVOT peak gradient, S                     5     mm Hg    ---------  Stroke volume (SV), LVOT DP               93.9  ml       ---------  Stroke index (SV/bsa), LVOT DP            41.3  ml/m^2   ---------    Aortic valve                              Value          Reference  Aortic valve peak velocity, S             239   cm/s     ---------  Aortic valve mean velocity, S             163   cm/s     ---------  Aortic valve VTI, S                       47.2  cm       ---------  Aortic mean gradient, S                   12    mm Hg    ---------  Aortic peak gradient, S                   23    mm Hg    ---------  VTI ratio, LVOT/AV                        0.52           ---------  Aortic valve area, VTI                    1.99  cm^2     ---------  Aortic valve area/bsa, VTI                0.88  cm^2/m^2 ---------  Velocity ratio, peak, LVOT/AV             0.47           ---------  Aortic valve area, peak velocity          1.78  cm^2     ---------  Aortic valve area/bsa, peak               0.78  cm^2/m^2 ---------  velocity  Velocity ratio, mean, LVOT/AV             0.51           ---------  Aortic valve area, mean velocity          1.95  cm^2     ---------  Aortic valve area/bsa, mean               0.86  cm^2/m^2 ---------  velocity    Aorta                                     Value          Reference  Aortic root ID, ED                        37    mm       ---------  Ascending aorta ID, A-P, S                44    mm       ---------    Left  atrium                               Value          Reference  LA ID, A-P, ES                            41    mm       ---------  LA ID/bsa, A-P                            1.81  cm/m^2   <=2.2  LA volume, S                              60  ml       ---------  LA volume/bsa, S                          26.4  ml/m^2   ---------  LA volume, ES, 1-p A4C                    55    ml       ---------  LA volume/bsa, ES, 1-p A4C                24.2  ml/m^2   ---------  LA volume, ES, 1-p A2C                    58    ml       ---------  LA volume/bsa, ES, 1-p A2C                25.5  ml/m^2   ---------    Mitral valve                              Value          Reference  Mitral E-wave peak velocity               73.7  cm/s     ---------  Mitral A-wave peak velocity               68.3  cm/s     ---------  Mitral deceleration time          (H)     250   ms       150 - 230  Mitral peak gradient, D                   2     mm Hg    ---------  Mitral E/A ratio, peak                    1.1            ---------    Right ventricle                           Value          Reference  RV s&', lateral, S                         15.7  cm/s     ---------  Legend: (L)  and  (H)  mark values outside specified reference range.  ------------------------------------------------------------------- Prepared and Electronically Authenticated by  Jenkins Rouge, M.D. 2019-06-03T09:54:48   Aortic Size Index=     4.4    /Body surface area is 2.28 meters squared. = 1.92  < 2.75 cm/m2      4% risk per year 2.75 to 4.25          8% risk per year > 4.25 cm/m2    20% risk per year    Assessment / Plan: #1 bicuspid aortic valve #2 dilated ascending aorta to 4.4 cm #3 family history of bicuspid aortic valve-no family history of aortic dissection, patient's mother did have emergency aortic valve replacement and a sending aortic replacement but this was precipitated by ventricular perforation at the time of heart  catheterization not aortic dissection I reviewed with  the patient the diagnosis of bicuspid aortic valve and dilated ascending aorta, reinforced the need for continued follow-up and possible aortic valve and ascending aortic replacement in the future.  He was cautioned about need for good blood pressure control, avoiding strenuous lifting/Valsalva, and avoidance of quinolones. Patient was given printed material about dilated a sending aorta is in the signs and symptoms of acute aortic dissection   CTA of the chest, July 2021-which will be 12  months from the time of his first scan    The [employer] [facility]) and the [place]) have issued a statement to clarify 2 previous guidelines from the [employer], [place], and collaborating societies addressing the risk of aortic dissection in patients with bicuspid aortic valves (BAV) and severe aortic enlargement. The 2 guidelines differ with regard to the recommended threshold of aortic root or ascending aortic dilatation that would justify surgical intervention in patients with bicuspid aortic valves. This new statement of clarification uses the ACC/AHA revised structure for delineating the Class of Recommendation and Level of Evidence to provide recommendation that replace those contained in Section 9.2.2.1 of the thoracic aortic disease guidelines and Section 5.1.3 of the valvular heart disease guideline. New recommendations in intervention in patients with BAV and dilatation of the aortic root (sinuses) or ascending aorta include:  . Operative intervention to repair or replace the aortic root (sinuses) or replace the ascending aorta is indicated in asymptomatic patients with BAV if the diameter of the aortic root or ascending aorta is 5.5 cm or greater. (Class of recommendation 1, Level of evidence B-NR).  [name] Operative intervention to repair or replace the aortic root (sinuses) or replace the ascending aorta is reasonable in  asymptomatic patients with BAV if the diameter of the aortic root or ascending aorta is 5.0 cm or greater and an additional risk factor for dissection is present or if the patient is at low surgical risk and the surgery is performed by an experienced aortic surgical team in a center with established expertise in these procedures. (Class of recommendation IIa; Level of Evidence B-NR).  . Replacement of the ascending aorta is reasonable in patients with BAV undergoing AVR because of severe aortic stenosis or aortic regurgitation when the diameter of the ascending aorta is greater than 4.5 cm (Class of recommendation IIa; Level of evidence C-EO).  Citation: Donnamarie Poag, Randolm Idol, et al. Surgery for aortic dilatation in patients with bicuspid aortic valves. A statement of clarification from the [employer]/American Heart Association Task Force on Clinical Practice Guidelines. [Published online ahead of print June 14, 2014]. Circulation. doi: 10.1161/CIR.0000000000000331.   Grace Isaac MD      [address] [place] [address] Office [phone]     05/24/2019 5:37 PM

## 2019-06-01 ENCOUNTER — Other Ambulatory Visit: Payer: Self-pay

## 2019-06-01 ENCOUNTER — Other Ambulatory Visit: Payer: 59 | Admitting: *Deleted

## 2019-06-01 DIAGNOSIS — I10 Essential (primary) hypertension: Secondary | ICD-10-CM

## 2019-06-01 LAB — BASIC METABOLIC PANEL
BUN/Creatinine Ratio: 21 — ABNORMAL HIGH (ref 9–20)
BUN: 20 mg/dL (ref 6–24)
CO2: 22 mmol/L (ref 20–29)
Calcium: 9.6 mg/dL (ref 8.7–10.2)
Chloride: 100 mmol/L (ref 96–106)
Creatinine, Ser: 0.95 mg/dL (ref 0.76–1.27)
GFR calc Af Amer: 106 mL/min/{1.73_m2} (ref >59)
GFR calc non Af Amer: 92 mL/min/{1.73_m2} (ref >59)
Glucose: 99 mg/dL (ref 65–99)
Potassium: 4.2 mmol/L (ref 3.5–5.2)
Sodium: 139 mmol/L (ref 134–144)

## 2019-06-04 ENCOUNTER — Telehealth: Payer: Self-pay

## 2019-06-04 NOTE — Telephone Encounter (Signed)
Notes recorded by Frederik Schmidt, RN on 06/04/2019 at 9:41 AM EST  The patient has been notified of the result and verbalized understanding. All questions (if any) were answered.  Frederik Schmidt, RN 06/04/2019 9:41 AM

## 2019-06-04 NOTE — Telephone Encounter (Signed)
-----   Message from Isaiah Serge, NP sent at 06/01/2019  6:45 PM EST ----- Labs stable no changes in meds

## 2019-08-06 NOTE — Progress Notes (Addendum)
Patient ID: Lee Grant                 DOB: 24-Dec-1965                      MRN: [id number]     HPI: Lee Grant is a 54 y.o. male patient of Dr. Tamala Julian referred by Cecilie Kicks, NP to HTN clinic. PMH is significant for hx of essential hypertension, dilated aortic root (now at 4.4 X 4.4 cm), bicuspid aortic valve, with suspicion of bicuspid valve related aortopathy. At last appt with Cecilie Kicks (05/23/2019) HCTZ 12.5 mg was added to losartan. Dr. Tamala Julian has previously noted he prefers therapy with losartan and beta-blocker therapy considering they are preventive with reference to aortic aneurysm expansion.  Patient presents for initial HTN clinic appt. Patient states he has significantly increased exercising since last appt and his PCP has started him on a cholesterol medication. He isn't sure the exact medication name, but states he will call the clinic later today to provide [place] the medication name to update in his chart. He has not experienced any SOB/dizziness/chest pain/headaches/palpitations/weakness.  Current HTN meds:  losartan-HCTZ 50-12.5 mg daily, metoprolol succinate 25 mg daily Previously tried: none BP goal: <130/80 mmHg  Family History: brother (bicuspid aortic valve and aortopathy); father (aneurysm); mother (aneurysm, hypotension)  Social History: former smoker (20 years ago), occasional alcohol use (2 drinks/week)  Diet: cut down on salt, has been trying to improve diet -fast food: 2x/week -microwave/frozen meals: frozen pizza every once in a while -canned vegetables: 5x/week -coffee: 2-3 cups/day -soda: 1-2 week  Exercise: 5 miles walking/jogging per day  Home BP readings: has not been checking   Wt Readings from Last 3 Encounters:  05/24/19 234 lb (106.1 kg)  05/23/19 234 lb (106.1 kg)  04/24/19 235 lb (106.6 kg)   BP Readings from Last 3 Encounters:  05/24/19 (!) 155/90  05/23/19 136/86  04/24/19 (!) 149/96   Pulse Readings from Last 3 Encounters:   05/24/19 64  05/23/19 66  04/24/19 73    Renal function: CrCl cannot be calculated (Patient's most recent lab result is older than the maximum 21 days allowed.).  Past Medical History:  Diagnosis Date  . Aortic root dilatation ([place])   . Bicuspid aortic valve   . Hypertension     Current Outpatient Medications on File Prior to Visit  Medication Sig Dispense Refill  . losartan-hydrochlorothiazide (HYZAAR) 50-12.5 MG tablet Take 1 tablet by mouth daily. 90 tablet 3  . metoprolol succinate (TOPROL XL) 25 MG 24 hr tablet Take 1 tablet (25 mg total) by mouth daily. 90 tablet 3   No current facility-administered medications on file prior to visit.    Allergies  Allergen Reactions  . Quinolones     Patient was warned about not using Cipro and similar antibiotics. Recent studies have raised concern that fluoroquinolone antibiotics could be associated with an increased risk of aortic aneurysm Fluoroquinolones have non-antimicrobial properties that might jeopardise the integrity of the extracellular matrix of the vascular wall In a  propensity score matched cohort study in [place], there was a 66% increased rate of aortic aneurysm or dissection associated with oral fluoroquinolone use, compared wit     Assessment/Plan:  1. Hypertension - goal BP < 130/80 mmHg; therefore, patient is extremely close to goal. Continue losartan-HCTZ 50-12.5 mg daily and metoprolol succinate 25 mg daily. Patient has a sufficient supply of refills at the current moment. Continue lifestyle modifications -  improving diet and exercising daily. Recommend patient wash canned vegetables before use. Advised patient to follow up with HTN clinic if he has any further concerns regarding his BP. Patient verbalized understanding.   Thank you for involving pharmacy to assist in providing this patient's care.   Drexel Iha, PharmD PGY2 Ambulatory Care Pharmacy Resident  Addendum: Pt called clinic to inform us that he is  taking atorvastatin 10mg  daily. Med list has been updated.

## 2019-08-07 ENCOUNTER — Encounter (INDEPENDENT_AMBULATORY_CARE_PROVIDER_SITE_OTHER): Payer: Self-pay

## 2019-08-07 ENCOUNTER — Ambulatory Visit (INDEPENDENT_AMBULATORY_CARE_PROVIDER_SITE_OTHER): Payer: 59 | Admitting: Pharmacist

## 2019-08-07 ENCOUNTER — Other Ambulatory Visit: Payer: Self-pay

## 2019-08-07 VITALS — BP 122/84 | HR 69

## 2019-08-07 DIAGNOSIS — I10 Essential (primary) hypertension: Secondary | ICD-10-CM

## 2019-08-07 NOTE — Patient Instructions (Addendum)
It was a pleasure seeing you in clinic today Mr. Ehinger!  Today the plan is... 1. Continue losartan-HCTZ 50-12.5 mg daily 2. Continue metoprolol succinate 25 mg daily   Please call the PharmD clinic at [phone] if you have any questions that you would like to speak with a pharmacist about [facility], [place], [place]).

## 2019-12-24 ENCOUNTER — Other Ambulatory Visit: Payer: Self-pay | Admitting: *Deleted

## 2019-12-24 DIAGNOSIS — I712 Thoracic aortic aneurysm, without rupture, unspecified: Secondary | ICD-10-CM

## 2020-01-31 ENCOUNTER — Ambulatory Visit (INDEPENDENT_AMBULATORY_CARE_PROVIDER_SITE_OTHER): Payer: 59 | Admitting: Cardiothoracic Surgery

## 2020-01-31 ENCOUNTER — Other Ambulatory Visit: Payer: Self-pay

## 2020-01-31 ENCOUNTER — Ambulatory Visit
Admission: RE | Admit: 2020-01-31 | Discharge: 2020-01-31 | Disposition: A | Discharge status: Home / Self Care | Payer: 59 | Source: Ambulatory Visit | Attending: Cardiothoracic Surgery | Admitting: Cardiothoracic Surgery

## 2020-01-31 VITALS — BP 132/86 | HR 60 | Temp 97.9°F | Resp 20 | Ht 69.5 in | Wt 221.0 lb

## 2020-01-31 DIAGNOSIS — I7121 Aneurysm of the ascending aorta, without rupture: Secondary | ICD-10-CM

## 2020-01-31 DIAGNOSIS — I712 Thoracic aortic aneurysm, without rupture, unspecified: Secondary | ICD-10-CM

## 2020-01-31 MED ORDER — IOPAMIDOL (ISOVUE-370) INJECTION 76%
75.0000 mL | Freq: Once | INTRAVENOUS | Status: AC | PRN
  Administered 2020-01-31: 75 mL via INTRAVENOUS

## 2020-01-31 NOTE — Patient Instructions (Signed)

## 2020-01-31 NOTE — Progress Notes (Signed)
[address]       [place] [address]             [phone]                    Lee Grant [facility] [place] Medical Record #[id number] Date of Birth: 1965/12/20  Referring: Belva Crome, MD Primary Care: Kristen Loader, FNP Primary Cardiologist: Sinclair Grooms, MD  Chief Complaint:    Chief Complaint  Patient presents with  . Thoracic Aortic Aneurysm    8 month f/u with CTA Chest    History of Present Illness:    Lee Grant 54 y.o. male is seen in the office  Today with follow-up CTA of the chest  for evaluation of mildly dilated ascending aorta.  The patient's brother was found to have a bicuspid aortic valve and dilated aorta and because of this the patient sought cardiology evaluation.  Echocardiogram confirmed bicuspid aortic valve.  In July 2020 the patient had a CTA of the chest.  The patient denies any anginal symptoms or symptoms of heart failure.    He has a family history both of bicuspid valve, abdominal aneurysm in his father, emergency aortic valve replacement and ascending aortic replacement when she had a perforated left ventricle at the time of cardiac catheterization evaluating aortic stenosis - 2005  Current Activity/ Functional Status:  Patient is independent with mobility/ambulation, transfers, ADL's, IADL's.   Zubrod Score: At the time of surgery this patient's most appropriate activity status/level should be described as: [x]     0    Normal activity, no symptoms []     1    Restricted in physical strenuous activity but ambulatory, able to do out light work []     2    Ambulatory and capable of self care, unable to do work activities, up and about               >50 % of waking hours                              []     3    Only limited self care, in bed greater than 50% of waking hours []     4    Completely disabled, no self care, confined to bed or chair []     5    Moribund   Past Medical History:  Diagnosis Date  .  Aortic root dilatation ([place])   . Bicuspid aortic valve   . Hypertension     No past surgical history on file.  Family History  Problem Relation Age of Onset  . Hypotension Mother   . Aneurysm Mother   . Aneurysm Father   . Healthy Sister   . Other Brother        bicuspid aortic valve   Patient's family history is significant for a brother with a mildly dilated ascending aorta and bicuspid valve being followed, 1 brother with normal aorta.  His father had an abdominal aortic aneurysm repaired by Dr. Donnetta Hutching.  His mother had an emergency aortic valve replacement and ascending aortic replacement when she had a perforated left ventricle at the time of cardiac catheterization evaluating aortic stenosis.   Social History   Tobacco Use  Smoking Status Former Smoker  Smokeless Tobacco Never Used    Social History   Substance and Sexual Activity  Alcohol Use None  Allergies  Allergen Reactions  . Quinolones     Patient was warned about not using Cipro and similar antibiotics. Recent studies have raised concern that fluoroquinolone antibiotics could be associated with an increased risk of aortic aneurysm Fluoroquinolones have non-antimicrobial properties that might jeopardise the integrity of the extracellular matrix of the vascular wall In a  propensity score matched cohort study in [place], there was a 66% increased rate of aortic aneurysm or dissection associated with oral fluoroquinolone use, compared wit    Current Outpatient Medications  Medication Sig Dispense Refill  . atorvastatin (LIPITOR) 10 MG tablet Take 10 mg by mouth daily.    [name] losartan-hydrochlorothiazide (HYZAAR) 50-12.5 MG tablet Take 1 tablet by mouth daily. 90 tablet 3  . metoprolol succinate (TOPROL XL) 25 MG 24 hr tablet Take 1 tablet (25 mg total) by mouth daily. 90 tablet 3   No current facility-administered medications for this visit.     Review of Systems:     Cardiac Review of Systems: [Y] = yes   or   [ N ] = no   Chest Pain [   n]  Resting SOB [  n] Exertional SOB  [ n ]  Orthopnea [n  ]   Pedal Edema [ n  ]    Palpitations [ n] Syncope  [ n]   Presyncope [n  ]   General Review of Systems: [Y] = yes [  ]=no Constitional: recent weight change [  ];  Wt loss over the last 3 months [   ] anorexia [  ]; fatigue [  ]; nausea [  ]; night sweats [  ]; fever [  ]; or chills [  ];           Eye : blurred vision [  ]; diplopia [   ]; vision changes [  ];  Amaurosis fugax[  ]; Resp: cough [  ];  wheezing[  ];  hemoptysis[  ]; shortness of breath[  ]; paroxysmal nocturnal dyspnea[  ]; dyspnea on exertion[  ]; or orthopnea[  ];  GI:  gallstones[  ], vomiting[  ];  dysphagia[  ]; melena[  ];  hematochezia [  ]; heartburn[  ];   Hx of  Colonoscopy[  ]; GU: kidney stones [  ]; hematuria[  ];   dysuria [  ];  nocturia[  ];  history of     obstruction [  ]; urinary frequency [  ]             Skin: rash, swelling[  ];, hair loss[  ];  peripheral edema[  ];  or itching[  ]; Musculosketetal: myalgias[  ];  joint swelling[  ];  joint erythema[  ];  joint pain[  ];  back pain[  ];  Heme/Lymph: bruising[  ];  bleeding[  ];  anemia[  ];  Neuro: TIA[  ];  headaches[  ];  stroke[  ];  vertigo[  ];  seizures[  ];   paresthesias[  ];  difficulty walking[  ];  Psych:depression[  ]; anxiety[  ];  Endocrine: diabetes[  ];  thyroid dysfunction[  ];  Immunizations: Flu up to date [ y]; Pneumococcal up to date [ y ];covid [  ]  Other:     PHYSICAL EXAMINATION: BP (!) 132/86   Pulse 60   Temp 97.9 F (36.6 C) (Skin)   Resp 20   Ht 5' 9.5" (1.765 m)   Wt (!) 221 lb (100.2 kg)  SpO2 97% Comment: RA  BMI 32.17 kg/m  General appearance: alert, cooperative and appears stated age Head: Normocephalic, without obvious abnormality, atraumatic Neck: no adenopathy, no carotid bruit, no JVD, supple, symmetrical, trachea midline and thyroid not enlarged, symmetric, no tenderness/mass/nodules Resp: clear to auscultation  bilaterally Cardio: regular rate and rhythm, S1, S2 normal, no murmur, click, rub or gallop GI: soft, non-tender; bowel sounds normal; no masses,  no organomegaly Extremities: extremities normal, atraumatic, no cyanosis or edema and Homans sign is negative, no sign of DVT Neurologic: Grossly normal Patient has palpable DP and PT pulses bilaterally, he has no palpable abdominal aneurysm or popliteal aneurysms  Diagnostic Studies & Laboratory data:     Recent Radiology Findings:  CT ANGIO CHEST AORTA W/CM & OR WO/CM  Result Date: 01/31/2020 CLINICAL DATA:  Follow-up thoracic aortic aneurysm. History of bicuspid aortic valve. Creatinine was obtained on site at [facility] at [address] Results: Creatinine 1.0 mg/dL. EXAM: CT ANGIOGRAPHY CHEST WITH CONTRAST TECHNIQUE: Multidetector CT imaging of the chest was performed using the standard protocol during bolus administration of intravenous contrast. Multiplanar CT image reconstructions and MIPs were obtained to evaluate the vascular anatomy. CONTRAST:  67mL ISOVUE-370 IOPAMIDOL (ISOVUE-370) INJECTION 76% COMPARISON:  02/05/2019 FINDINGS: Cardiovascular: There is mild aneurysmal dilatation of the ascending aorta with a maximal diameter of 4.1 cm on today's study. The 4.4 cm diameter reported on the prior CTA was exaggerated due to motion artifact. The transverse and descending thoracic aorta are normal in caliber. No central pulmonary emboli are identified. The heart is normal in size. There is no pericardial effusion. Mediastinum/Nodes: Unchanged 1.3 cm hypoattenuating right thyroid nodule which is not considered to be clinically significant and for which no imaging follow-up is recommended. No enlarged axillary, mediastinal, or hilar lymph nodes. Unremarkable esophagus. Lungs/Pleura: No pleural effusion or pneumothorax.  Clear lungs. Upper Abdomen: 2 small low-density lesions in the liver measure up to 1.1 cm, unchanged and likely cysts.  Musculoskeletal: No acute osseous abnormality or suspicious osseous lesion. Review of the MIP images confirms the above findings. IMPRESSION: 4.1 cm ascending aortic aneurysm without interval enlargement. Electronically Signed   By: Logan Bores M.D.   On: 01/31/2020 11:22   I have independently reviewed the above radiology studies  and reviewed the findings with the patient.      CLINICAL DATA:  Ascending thoracic aortic dilatation. History of bicuspid aortic valve  EXAM: CT ANGIOGRAPHY CHEST WITH CONTRAST  TECHNIQUE: Multidetector CT imaging of the chest was performed using the standard protocol during bolus administration of intravenous contrast. Multiplanar CT image reconstructions and MIPs were obtained to evaluate the vascular anatomy.  CONTRAST:  16mL OMNIPAQUE IOHEXOL 350 MG/ML SOLN  COMPARISON:  None.  FINDINGS: Cardiovascular: Ascending thoracic aortic diameter measures 4.4 x 4.4 cm. At the level of the sinus of Valsalva, the measured transverse diameter measures 3.8 cm. At the sinotubular junction, the measured transverse diameter is measured at 3.2 cm. The measured diameter of the aorta at aortic arch measures 3.1 cm. The measured transverse diameter of the descending aorta at the main pulmonary outflow tract level measures 2.7 x 2.7 cm. No evident dissection. Visualized great vessels appear unremarkable. No evident pulmonary embolus. No pericardial effusion or pericardial thickening evident.  Mediastinum/Nodes: Visualized thyroid appears unremarkable. There is no evident thoracic adenopathy. No esophageal lesions are evident.  Lungs/Pleura: There is no edema or consolidation. No pleural effusion evident.  Upper Abdomen: There is hepatic steatosis. There are scattered subcentimeter cysts  in the liver. Visualized upper abdominal structures otherwise appear unremarkable. A tiny splenule is noted incidentally anterior to the spleen.  Musculoskeletal:  There are no blastic or lytic bone lesions. No chest wall lesions evident.  Review of the MIP images confirms the above findings.  IMPRESSION: 1. Ascending thoracic aortic diameter measures 4.4 x 4.4 cm. No evident dissection. Recommend annual imaging followup by CTA or MRA. This recommendation follows 2010 ACCF/AHA/AATS/ACR/ASA/SCA/SCAI/SIR/STS/SVM Guidelines for the Diagnosis and Management of Patients with Thoracic Aortic Disease. Circulation. 2010; 121: I967-E938. Aortic aneurysm NOS (ICD10-I71.9).  2.  No pulmonary embolus evident.  3.  No lung edema or consolidation.  4.  No demonstrable adenopathy.  5.  Hepatic steatosis.   Electronically Signed   By: Lowella Grip III M.D.   On: 02/05/2019 10:27  I have independently reviewed the above radiology studies  and reviewed the findings with the patient.   Recent Lab Findings: Lab Results  Component Value Date   GLUCOSE 99 06/01/2019   NA 139 06/01/2019   K 4.2 06/01/2019   CL 100 06/01/2019   CREATININE 0.95 06/01/2019   BUN 20 06/01/2019   CO2 22 06/01/2019   ECHO 12/2017 Transthoracic Echocardiography  Patient:    Olie, Dibert MR #:       [id number] Study Date: 12/12/2017 Gender:     M Age:        56 Height:     177.8 cm Weight:     101.6 kg BSA:        2.27 m^2 Pt. Status: Room:   ATTENDING    Belva Crome, MD  Livia Snellen     Belva Crome, MD  REFERRING    Belva Crome, MD  SONOGRAPHER  Cindy Hazy, RDCS  PERFORMING   [facility], Outpatient  cc:  ------------------------------------------------------------------- LV EF: 60% -   65%  ------------------------------------------------------------------- Indications:      Q23.1 Bicuspid Aortic Valve.  ------------------------------------------------------------------- History:   PMH:  Acquired from the patient and from the patient&'s chart.  PMH:  Bicuspid Aortic Valve.  Risk factors:  Hypertension.     ------------------------------------------------------------------- Study Conclusions  - Left ventricle: The cavity size was normal. Systolic function was   normal. The estimated ejection fraction was in the range of 60%   to 65%. Wall motion was normal; there were no regional wall   motion abnormalities. - Aortic valve: A bicuspid morphology cannot be excluded;   moderately thickened, mildly calcified leaflets. There was mild   stenosis. - Left atrium: The atrium was mildly dilated. - Atrial septum: No defect or patent foramen ovale was identified.  ------------------------------------------------------------------- Study data:   Study status:  Routine.  Procedure:  The patient reported no pain pre or post test. Transthoracic echocardiography for left ventricular function evaluation, for right ventricular function evaluation, and for assessment of valvular function. Image quality was adequate.  Study completion:  There were no complications.          Transthoracic echocardiography.  M-mode, complete 2D, spectral Doppler, and color Doppler.  Birthdate: Patient birthdate: Apr 23, 1966.  Age:  Patient is 54 yr old.  Sex: Gender: male.    BMI: 32.1 kg/m^2.  Blood pressure:     126/90 Patient status:  Outpatient.  Study date:  Study date: 12/12/2017. Study time: 07:59 AM.  Location:  [facility]  -------------------------------------------------------------------  ------------------------------------------------------------------- Left ventricle:  The cavity size was normal. Systolic function was normal. The estimated ejection fraction was in the range of 60% to  65%. Wall motion was normal; there were no regional wall motion abnormalities.  ------------------------------------------------------------------- Aortic valve:   A bicuspid morphology cannot be excluded; moderately thickened, mildly calcified leaflets. Mobility was not restricted.  Doppler:   There was mild  stenosis.   There was no regurgitation.    VTI ratio of LVOT to aortic valve: 0.52. Valve area (VTI): 1.99 cm^2. Indexed valve area (VTI): 0.88 cm^2/m^2. Peak velocity ratio of LVOT to aortic valve: 0.47. Valve area (Vmax): 1.78 cm^2. Indexed valve area (Vmax): 0.78 cm^2/m^2. Mean velocity ratio of LVOT to aortic valve: 0.51. Valve area (Vmean): 1.95 cm^2. Indexed valve area (Vmean): 0.86 cm^2/m^2.    Mean gradient (S): 12 mm Hg. Peak gradient (S): 23 mm Hg.  ------------------------------------------------------------------- Aorta:  The aorta was moderately dilated. Aortic root: The aortic root was normal in size.  ------------------------------------------------------------------- Mitral valve:   Mildly thickened leaflets . Mobility was not restricted.  Doppler:  Transvalvular velocity was within the normal range. There was no evidence for stenosis. There was trivial regurgitation.    Peak gradient (D): 2 mm Hg.  ------------------------------------------------------------------- Left atrium:  The atrium was mildly dilated.  ------------------------------------------------------------------- Atrial septum:  No defect or patent foramen ovale was identified.   ------------------------------------------------------------------- Right ventricle:  The cavity size was normal. Wall thickness was normal. Systolic function was normal.  ------------------------------------------------------------------- Pulmonic valve:    Doppler:  Transvalvular velocity was within the normal range. There was no evidence for stenosis. There was mild regurgitation.  ------------------------------------------------------------------- Tricuspid valve:   Structurally normal valve.    Doppler: Transvalvular velocity was within the normal range. There was mild regurgitation.  ------------------------------------------------------------------- Pulmonary artery:   The main pulmonary artery was  normal-sized. Systolic pressure was within the normal range.  ------------------------------------------------------------------- Right atrium:  The atrium was normal in size.  ------------------------------------------------------------------- Pericardium:  There was no pericardial effusion.  ------------------------------------------------------------------- Systemic veins: Inferior vena cava: The vessel was normal in size. The respirophasic diameter changes were in the normal range (= 50%), consistent with normal central venous pressure.  ------------------------------------------------------------------- Post procedure conclusions Ascending Aorta:  - The aorta was moderately dilated.  ------------------------------------------------------------------- Measurements   Left ventricle                            Value          Reference  LV ID, ED, PLAX chordal                   48.9  mm       43 - 52  LV ID, ES, PLAX chordal                   25.4  mm       23 - 38  LV fx shortening, PLAX chordal            48    %        >=29  LV PW thickness, ED                       10.8  mm       ---------  IVS/LV PW ratio, ED                       0.99           <=1.3  Stroke volume, 2D  94    ml       ---------  Stroke volume/bsa, 2D                     41    ml/m^2   ---------  LV e&', lateral                            11.6  cm/s     ---------  LV E/e&', lateral                          6.35           ---------  LV e&', medial                             11    cm/s     ---------  LV E/e&', medial                           6.7            ---------  LV e&', average                            11.3  cm/s     ---------  LV E/e&', average                          6.52           ---------    Ventricular septum                        Value          Reference  IVS thickness, ED                         10.7  mm       ---------    LVOT                                       Value          Reference  LVOT ID, S                                22    mm       ---------  LVOT area                                 3.8   cm^2     ---------  LVOT ID                                   22    mm       ---------  LVOT peak velocity, S                     112   cm/s     ---------  LVOT mean velocity, S  83.7  cm/s     ---------  LVOT VTI, S                               24.7  cm       ---------  LVOT peak gradient, S                     5     mm Hg    ---------  Stroke volume (SV), LVOT DP               93.9  ml       ---------  Stroke index (SV/bsa), LVOT DP            41.3  ml/m^2   ---------    Aortic valve                              Value          Reference  Aortic valve peak velocity, S             239   cm/s     ---------  Aortic valve mean velocity, S             163   cm/s     ---------  Aortic valve VTI, S                       47.2  cm       ---------  Aortic mean gradient, S                   12    mm Hg    ---------  Aortic peak gradient, S                   23    mm Hg    ---------  VTI ratio, LVOT/AV                        0.52           ---------  Aortic valve area, VTI                    1.99  cm^2     ---------  Aortic valve area/bsa, VTI                0.88  cm^2/m^2 ---------  Velocity ratio, peak, LVOT/AV             0.47           ---------  Aortic valve area, peak velocity          1.78  cm^2     ---------  Aortic valve area/bsa, peak               0.78  cm^2/m^2 ---------  velocity  Velocity ratio, mean, LVOT/AV             0.51           ---------  Aortic valve area, mean velocity          1.95  cm^2     ---------  Aortic valve area/bsa, mean               0.86  cm^2/m^2 ---------  velocity    Aorta  Value          Reference  Aortic root ID, ED                        37    mm       ---------  Ascending aorta ID, A-P, S                44    mm       ---------    Left atrium                                Value          Reference  LA ID, A-P, ES                            41    mm       ---------  LA ID/bsa, A-P                            1.81  cm/m^2   <=2.2  LA volume, S                              60    ml       ---------  LA volume/bsa, S                          26.4  ml/m^2   ---------  LA volume, ES, 1-p A4C                    55    ml       ---------  LA volume/bsa, ES, 1-p A4C                24.2  ml/m^2   ---------  LA volume, ES, 1-p A2C                    58    ml       ---------  LA volume/bsa, ES, 1-p A2C                25.5  ml/m^2   ---------    Mitral valve                              Value          Reference  Mitral E-wave peak velocity               73.7  cm/s     ---------  Mitral A-wave peak velocity               68.3  cm/s     ---------  Mitral deceleration time          (H)     250   ms       150 - 230  Mitral peak gradient, D                   2     mm Hg    ---------  Mitral E/A ratio, peak  1.1            ---------    Right ventricle                           Value          Reference  RV s&', lateral, S                         15.7  cm/s     ---------  Legend: (L)  and  (H)  mark values outside specified reference range.  ------------------------------------------------------------------- Prepared and Electronically Authenticated by  Jenkins Rouge, M.D. 2019-06-03T09:54:48   Aortic Size Index=     4.4    /Body surface area is 2.22 meters squared. = 1.92  < 2.75 cm/m2      4% risk per year 2.75 to 4.25          8% risk per year > 4.25 cm/m2    20% risk per year    Assessment / Plan: #1 bicuspid aortic valve #2 dilated ascending aorta to 4.1  cm #3 family history of bicuspid aortic valve-no family history of aortic dissection, patient's mother did have emergency aortic valve replacement and ascending aortic replacement but this was precipitated by ventricular perforation at the time of heart catheterization not  aortic dissection  I again  reviewed with the patient the diagnosis of bicuspid aortic valve and dilated ascending aorta, reinforced the need for continued follow-up and possible aortic valve and ascending aortic replacement in the future.  He was cautioned about need for good blood pressure control, avoiding strenuous lifting/Valsalva, and avoidance of quinolones.     CTA of the chest, July 2022 -planed  the [employer] [facility]) and the [facility]) have issued a statement to clarify 2 previous guidelines from the [facility], [place], and collaborating societies addressing the risk of aortic dissection in patients with bicuspid aortic valves (BAV) and severe aortic enlargement. The 2 guidelines differ with regard to the recommended threshold of aortic root or ascending aortic dilatation that would justify surgical intervention in patients with bicuspid aortic valves. This new statement of clarification uses the ACC/AHA revised structure for delineating the Class of Recommendation and Level of Evidence to provide recommendation that replace those contained in Section 9.2.2.1 of the thoracic aortic disease guidelines and Section 5.1.3 of the valvular heart disease guideline. New recommendations in intervention in patients with BAV and dilatation of the aortic root (sinuses) or ascending aorta include:  . Operative intervention to repair or replace the aortic root (sinuses) or replace the ascending aorta is indicated in asymptomatic patients with BAV if the diameter of the aortic root or ascending aorta is 5.5 cm or greater. (Class of recommendation 1, Level of evidence B-NR).  [name] Operative intervention to repair or replace the aortic root (sinuses) or replace the ascending aorta is reasonable in asymptomatic patients with BAV if the diameter of the aortic root or ascending aorta is 5.0 cm or greater and an additional risk factor for dissection is present or if the patient is at low  surgical risk and the surgery is performed by an experienced aortic surgical team in a center with established expertise in these procedures. (Class of recommendation IIa; Level of Evidence B-NR).  . Replacement of the ascending aorta is reasonable in patients with BAV undergoing AVR because of severe aortic stenosis or aortic regurgitation when the diameter of  the ascending aorta is greater than 4.5 cm (Class of recommendation IIa; Level of evidence C-EO).  Citation: Donnamarie Poag, Randolm Idol, et al. Surgery for aortic dilatation in patients with bicuspid aortic valves. A statement of clarification from the [employer]/American Heart Association Task Force on Clinical Practice Guidelines. [Published online ahead of print June 14, 2014]. Circulation. doi: 10.1161/CIR.0000000000000331.   Grace Isaac MD      [address] [place] [address] Office [phone]     01/31/2020 11:54 AM

## 2020-02-22 ENCOUNTER — Other Ambulatory Visit: Payer: Self-pay | Admitting: Interventional Cardiology

## 2020-05-23 ENCOUNTER — Other Ambulatory Visit: Payer: Self-pay | Admitting: Interventional Cardiology

## 2020-05-26 ENCOUNTER — Other Ambulatory Visit: Payer: Self-pay | Admitting: Cardiology

## 2020-06-09 ENCOUNTER — Telehealth: Payer: Self-pay | Admitting: Interventional Cardiology

## 2020-06-09 MED ORDER — METOPROLOL SUCCINATE ER 25 MG PO TB24: 25.0000 mg | ORAL_TABLET | Freq: Every day | ORAL | 1 refills | Status: DC

## 2020-06-09 MED ORDER — LOSARTAN POTASSIUM-HCTZ 50-12.5 MG PO TABS: 1.0000 | ORAL_TABLET | Freq: Every day | ORAL | 1 refills | Status: DC

## 2020-06-09 MED ORDER — ATORVASTATIN CALCIUM 10 MG PO TABS: 10.0000 mg | ORAL_TABLET | Freq: Every day | ORAL | 1 refills | Status: DC

## 2020-06-09 NOTE — Telephone Encounter (Signed)
*  STAT* If patient is at the pharmacy, call can be transferred to refill team.   1. Which medications need to be refilled? (please list name of each medication and dose if known)  metoprolol succinate (TOPROL-XL) 25 MG 24 hr tablet losartan-hydrochlorothiazide (HYZAAR) 50-12.5 MG tablet atorvastatin (LIPITOR) 10 MG tablet  2. Which pharmacy/location (including street and city if local pharmacy) is medication to be sent to? [id number] - , [place]  3. Do they need a 30 day or 90 day supply? 90 with refills  Pt is scheduled to see Dr. Tamala Julian 10/10/20

## 2020-06-09 NOTE — Telephone Encounter (Signed)
Pt's medications were sent to pt's pharmacy as requested. Confirmation received.  

## 2020-10-09 NOTE — Progress Notes (Signed)
Cardiology Office Note:    Date:  10/10/2020   ID:  Lee Grant, DOB [birth year], MRN [id number]  PCP:  Kristen Loader, FNP  Cardiologist:  Sinclair Grooms, MD   Referring MD: Kristen Loader, FNP   Chief Complaint  Patient presents with  . Coronary Artery Disease  . Congestive Heart Failure    History of Present Illness:    Friend Dorfman is a 55 y.o. male with a hx of essential hypertension, dilated aortic root now at 4.4 X 4.4 cm, bicuspid aortic valve, with suspicion of bicuspid valve related aortopathy.   He is doing well.  He has no shortness of breath, chest pain, fatigue, edema, orthopnea.  He denies palpitations.  He has been comanaged with Dr. Servando Snare.  I informed the patient that Dr. Servando Snare is retired.  He should have an appointment with one of the colleagues.  He will call me back in July to let us know if such an arrangement has been made.  He is scheduled to have CT scan performed in July.  Past Medical History:  Diagnosis Date  . Aortic root dilatation ([place])   . Bicuspid aortic valve   . Hypertension     History reviewed. No pertinent surgical history.  Current Medications: Current Meds  Medication Sig  . atorvastatin (LIPITOR) 10 MG tablet Take 1 tablet (10 mg total) by mouth daily. Please keep upcoming appt in April 2022 with Dr. Tamala Julian before anymore refills. Thank you  . losartan-hydrochlorothiazide (HYZAAR) 50-12.5 MG tablet Take 1 tablet by mouth daily. Please keep upcoming appt in April 2022 with Dr. Tamala Julian for future refills. Thank you  . metoprolol succinate (TOPROL-XL) 25 MG 24 hr tablet Take 1 tablet (25 mg total) by mouth daily. Please keep upcoming appt in April 2022 with Dr. Tamala Julian before anymore refills. Thank you     Allergies:   Quinolones   Social History   Socioeconomic History  . Marital status: Married    Spouse name: Not on file  . Number of children: Not on file  . Years of education: Not on file  . Highest education  level: Not on file  Occupational History  . Not on file  Tobacco Use  . Smoking status: Former Research scientist (life sciences)  . Smokeless tobacco: Never Used  Substance and Sexual Activity  . Alcohol use: Not on file  . Drug use: Not on file  . Sexual activity: Not on file  Other Topics Concern  . Not on file  Social History Narrative  . Not on file   Social Determinants of Health   Financial Resource Strain: Not on file  Food Insecurity: Not on file  Transportation Needs: Not on file  Physical Activity: Not on file  Stress: Not on file  Social Connections: Not on file     Family History: The patient's family history includes Aneurysm in his father and mother; Healthy in his sister; Hypotension in his mother; Other in his brother.  ROS:   Please see the history of present illness.    No complaints and no medication side effects all other systems reviewed and are negative.  EKGs/Labs/Other Studies Reviewed:    The following studies were reviewed today:  Chest CT performed July 2021: IMPRESSION: 4.1 cm ascending aortic aneurysm without interval enlargement.  Most recent 2D Doppler echocardiogram 2019: Study Conclusions   - Left ventricle: The cavity size was normal. Systolic function was  normal. The estimated ejection fraction was in the range of 60%  to 65%. Wall motion was normal; there were no regional wall  motion abnormalities.  - Aortic valve: A bicuspid morphology cannot be excluded;  moderately thickened, mildly calcified leaflets. There was mild  stenosis.  - Left atrium: The atrium was mildly dilated.  - Atrial septum: No defect or patent foramen ovale was identified.   EKG:  EKG normal sinus rhythm with prominent voltage.  Basically normal tracing.  Recent Labs: No results found for requested labs within last 8760 hours.  Recent Lipid Panel No results found for: CHOL, TRIG, HDL, CHOLHDL, VLDL, LDLCALC, LDLDIRECT  Physical Exam:    VS:  BP 130/82   Pulse 67    Ht 5' 9.5" (1.765 m)   Wt 234 lb 9.6 oz (106.4 kg)   SpO2 95%   BMI 34.15 kg/m     Wt Readings from Last 3 Encounters:  10/10/20 234 lb 9.6 oz (106.4 kg)  01/31/20 (!) 221 lb (100.2 kg)  05/24/19 234 lb (106.1 kg)     GEN: B's with BMI 34. No acute distress HEENT: Normal NECK: No JVD. LYMPHATICS: No lymphadenopathy CARDIAC: No murmur. RRR no gallop, or edema. VASCULAR:  Normal Pulses. No bruits. RESPIRATORY:  Clear to auscultation without rales, wheezing or rhonchi  ABDOMEN: Soft, non-tender, non-distended, No pulsatile mass, MUSCULOSKELETAL: No deformity  SKIN: Warm and dry NEUROLOGIC:  Alert and oriented x 3 PSYCHIATRIC:  Normal affect   ASSESSMENT:    1. Bicuspid aortic valve   2. Dilated aortic root ([place])   3. Essential hypertension    PLAN:    In order of problems listed above:  1. No murmur.  Last echo 2019.  Was normal auscultation has stable aortic root size, I do not feel any echo imaging is indicated. 2. Follow-up with T CTS.  Previously followed by Dr. Servando Snare.  Scheduled for CT scan this summer. 3. Blood pressure control is excellent on current regimen.  Low-salt diet is encouraged.  Target BP: <130/80 mmHg  Diet and lifestyle measures for BP control were reviewed in detail: Low sodium diet (<2.5 gm daily); alcohol restriction (<3 ounces per day); weight loss (Mediterranean); avoid non-steroidal agents; > 6 hours sleep per day; 150 min moderate exercise per week. Medical regimen will include at least 2 agents. Resistant hypertension if not controlled on 3 agents. Consider further evaluation: Sleep study to r/o OSA; Renal angiogram; Primary hyperaldonism and Pheochromocytoma w/u. After 3 agents, consider MRA (spironolactone)/ Epleronone), hydralazine, beta-blocker, and Minoxidil if not already in use due to patient profile.    Medication Adjustments/Labs and Tests Ordered: Current medicines are reviewed at length with the patient today.  Concerns  regarding medicines are outlined above.  Orders Placed This Encounter  Procedures  . EKG 12-Lead   No orders of the defined types were placed in this encounter.   Patient Instructions  Medication Instructions:  Your physician recommends that you continue on your current medications as directed. Please refer to the Current Medication list given to you today.  *If you need a refill on your cardiac medications before your next appointment, please call your pharmacy*   Lab Work: None If you have labs (blood work) drawn today and your tests are completely normal, you will receive your results only by: [name] MyChart Message (if you have MyChart) OR . A paper copy in the mail If you have any lab test that is abnormal or we need to change your treatment, we will call you to review the results.   Testing/Procedures: None  Follow-Up: At [facility], you and your health needs are our priority.  As part of our continuing mission to provide you with exceptional heart care, we have created designated Provider Care Teams.  These Care Teams include your primary Cardiologist (physician) and Advanced Practice Providers (APPs -  Physician Assistants and Nurse Practitioners) who all work together to provide you with the care you need, when you need it.  We recommend signing up for the patient portal called "MyChart".  Sign up information is provided on this After Visit Summary.  MyChart is used to connect with patients for Virtual Visits (Telemedicine).  Patients are able to view lab/test results, encounter notes, upcoming appointments, etc.  Non-urgent messages can be sent to your provider as well.   To learn more about what you can do with MyChart, go to [email or web address].    Your next appointment:   1 year(s)  The format for your next appointment:   In Person  Provider:   You may see Sinclair Grooms, MD or one of the following Advanced Practice Providers on your designated Care Team:     Kathyrn Drown, NP    Other Instructions      Signed, Sinclair Grooms, MD  10/10/2020 8:36 AM    [place]

## 2020-10-10 ENCOUNTER — Other Ambulatory Visit: Payer: Self-pay

## 2020-10-10 ENCOUNTER — Encounter: Payer: Self-pay | Admitting: Interventional Cardiology

## 2020-10-10 ENCOUNTER — Ambulatory Visit: Payer: 59 | Admitting: Interventional Cardiology

## 2020-10-10 VITALS — BP 130/82 | HR 67 | Ht 69.5 in | Wt 234.6 lb

## 2020-10-10 DIAGNOSIS — I10 Essential (primary) hypertension: Secondary | ICD-10-CM

## 2020-10-10 DIAGNOSIS — Q231 Congenital insufficiency of aortic valve: Secondary | ICD-10-CM | POA: Diagnosis not present

## 2020-10-10 DIAGNOSIS — I77810 Thoracic aortic ectasia: Secondary | ICD-10-CM

## 2020-10-10 NOTE — Patient Instructions (Signed)

## 2020-12-22 ENCOUNTER — Other Ambulatory Visit: Payer: Self-pay | Admitting: Cardiothoracic Surgery

## 2020-12-22 DIAGNOSIS — I712 Thoracic aortic aneurysm, without rupture, unspecified: Secondary | ICD-10-CM

## 2020-12-22 DIAGNOSIS — Q231 Congenital insufficiency of aortic valve: Secondary | ICD-10-CM

## 2021-01-01 ENCOUNTER — Other Ambulatory Visit: Payer: Self-pay | Admitting: Interventional Cardiology

## 2021-01-09 ENCOUNTER — Other Ambulatory Visit: Payer: Self-pay | Admitting: Interventional Cardiology

## 2021-01-22 ENCOUNTER — Ambulatory Visit
Admission: RE | Admit: 2021-01-22 | Discharge: 2021-01-22 | Disposition: A | Discharge status: Home / Self Care | Payer: 59 | Source: Ambulatory Visit | Attending: Cardiothoracic Surgery | Admitting: Cardiothoracic Surgery

## 2021-01-22 ENCOUNTER — Ambulatory Visit: Payer: 59 | Admitting: Cardiothoracic Surgery

## 2021-01-22 ENCOUNTER — Telehealth (INDEPENDENT_AMBULATORY_CARE_PROVIDER_SITE_OTHER): Payer: 59 | Admitting: Cardiothoracic Surgery

## 2021-01-22 ENCOUNTER — Other Ambulatory Visit: Payer: Self-pay

## 2021-01-22 DIAGNOSIS — I712 Thoracic aortic aneurysm, without rupture, unspecified: Secondary | ICD-10-CM

## 2021-01-22 DIAGNOSIS — I77810 Thoracic aortic ectasia: Secondary | ICD-10-CM

## 2021-01-22 DIAGNOSIS — Q231 Congenital insufficiency of aortic valve: Secondary | ICD-10-CM

## 2021-01-22 MED ORDER — IOPAMIDOL (ISOVUE-370) INJECTION 76%
75.0000 mL | Freq: Once | INTRAVENOUS | Status: AC | PRN
  Administered 2021-01-22: 75 mL via INTRAVENOUS

## 2021-01-22 NOTE — Progress Notes (Signed)
Virtual visit is performed at the patient's request in the lieu of in-person consultation  Patient location: Home Practitioner location: Office  chief complaint: Aneurysm surveillance History of present illness: 55 year old man with a history of bicuspid aortic valve disease has been followed for small ascending aortic aneurysm.  He was last evaluated nearly a year ago.  In the interim he is ports no chest pain or shortness of breath and he has maintained his stable activity level.  He denies hypertension but continues treatment for this  Active Ambulatory Problems    Diagnosis Date Noted   Bicuspid aortic valve 01/25/2017   Dilated aortic root ([place]) 01/25/2017   Essential hypertension 01/26/2017   Resolved Ambulatory Problems    Diagnosis Date Noted   No Resolved Ambulatory Problems   Past Medical History:  Diagnosis Date   Aortic root dilatation ([facility])    Hypertension      Current Outpatient Medications on File Prior to Visit  Medication Sig Dispense Refill   atorvastatin (LIPITOR) 10 MG tablet Take 1 tablet (10 mg total) by mouth daily. 90 tablet 3   losartan-hydrochlorothiazide (HYZAAR) 50-12.5 MG tablet Take 1 tablet by mouth daily. Please keep upcoming appt in April 2022 with Dr. Tamala Julian for future refills. Thank you 90 tablet 1   metoprolol succinate (TOPROL-XL) 25 MG 24 hr tablet TAKE 1 TABLET (25 MG TOTAL) BY MOUTH DAILY. PLEASE KEEP UPCOMING APPT IN APRIL 2022 WITH DR. Tamala Julian BEFORE ANYMORE REFILLS. THANK YOU 90 tablet 3   No current facility-administered medications on file prior to visit.  Physical exam: Not available  Imaging: I personally reviewed his CT scan from today which demonstrates a stable 4.4 cm ascending aortic aneurysm  Impression/plan: Stable sub-5 cm ascending aortic aneurysm Agree with Dr. Everrett Coombe previous guidelines on when to consider repair electively as outlined in his previous note Follow-up in 1 year with repeat CT scan  Jahvier Aldea Z. Orvan Seen,  [place]

## 2021-04-08 ENCOUNTER — Other Ambulatory Visit: Payer: Self-pay | Admitting: Interventional Cardiology

## 2021-11-09 IMAGING — CT CT ANGIO CHEST
2 of 6 series · 13 of 36 positions shown · IV contrast (iopamidol)
Comparison: 01/31/2020

CLINICAL DATA: Thoracic aortic aneurysm, follow-up

EXAM:
CT ANGIOGRAPHY CHEST WITH CONTRAST
TECHNIQUE: Multidetector CT imaging of the chest was performed using the
standard protocol during bolus administration of intravenous
contrast. Multiplanar CT image reconstructions and MIPs were
obtained to evaluate the vascular anatomy.
CONTRAST:  75mL SLU12G-1MD IOPAMIDOL (SLU12G-1MD) INJECTION 76%

[Series 5: cta thorax 2.00 bv36 s3 axial arterial · axial · arterial · 0.73mm/px · z∈[+1626,+1888]mm · 12 of 155 slices shown]
[im 12/155  lung]
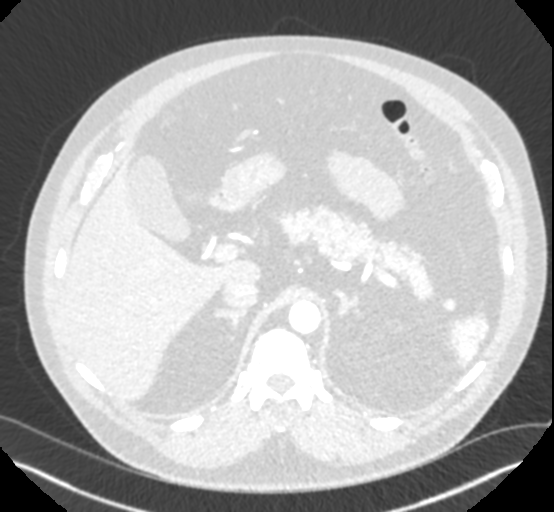
[im 24/155  mediastinal]
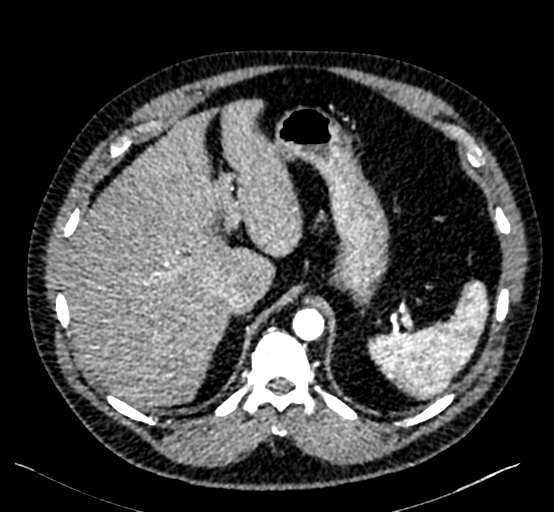
[im 36/155  lung]
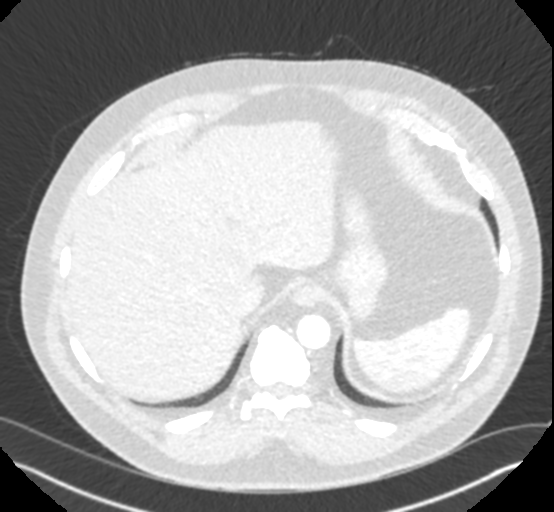
[im 48/155  mediastinal]
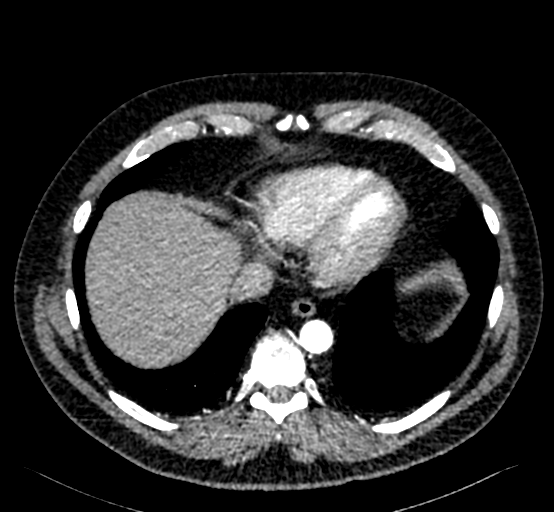
[im 60/155  lung]
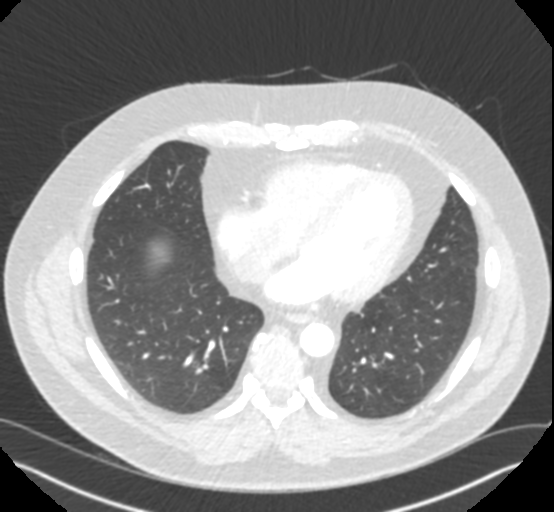
[im 72/155  mediastinal]
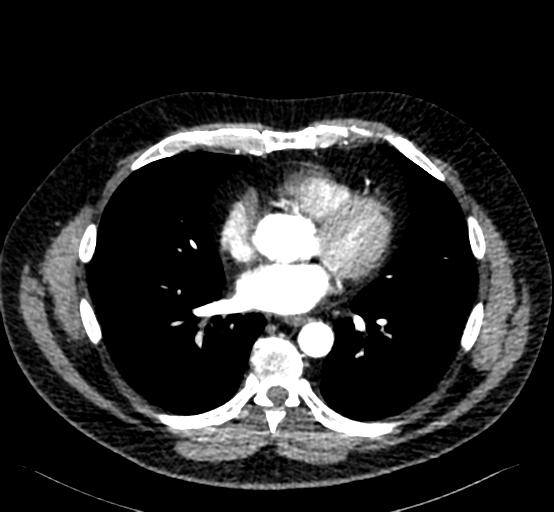
[im 83/155  lung]
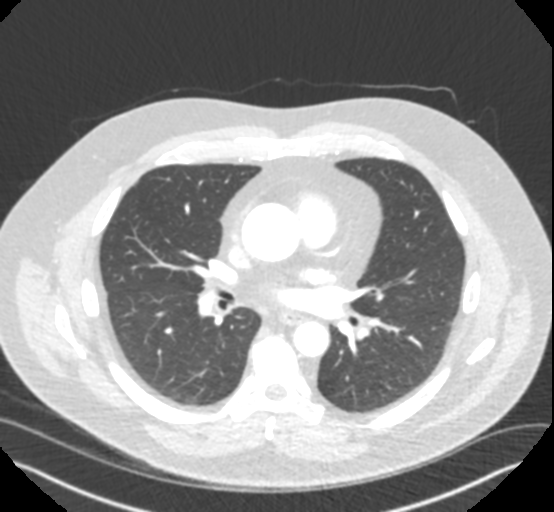
[im 95/155  mediastinal]
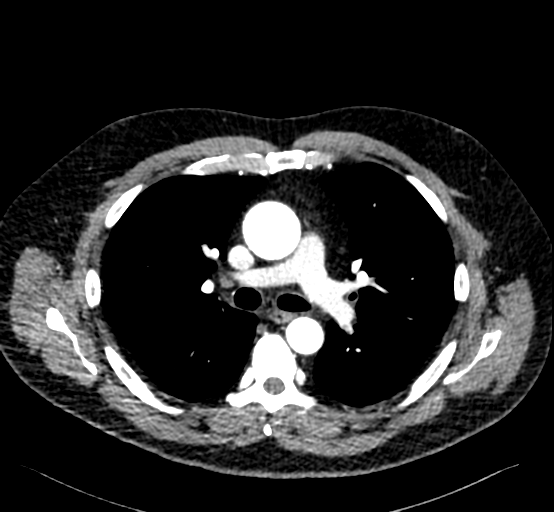
[im 107/155  lung]
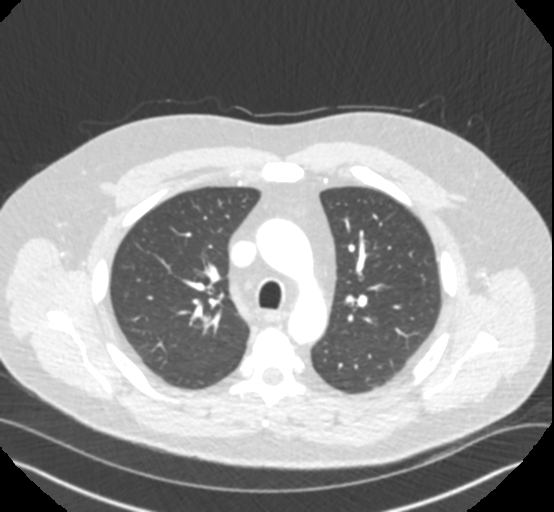
[im 119/155  mediastinal]
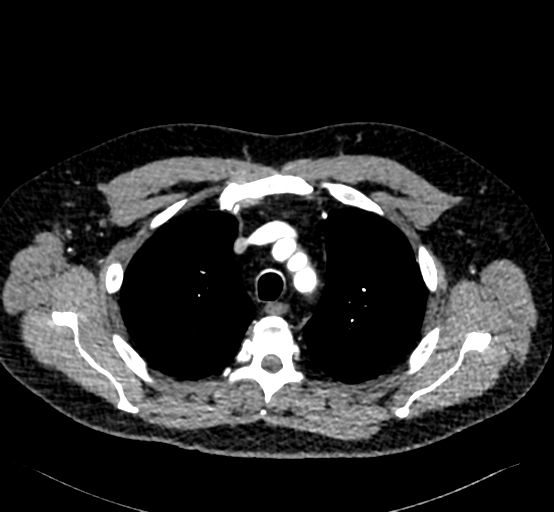
[im 131/155  lung]
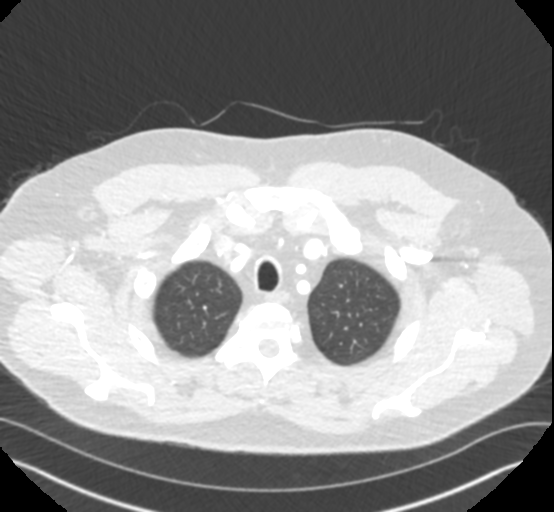
[im 143/155  mediastinal]
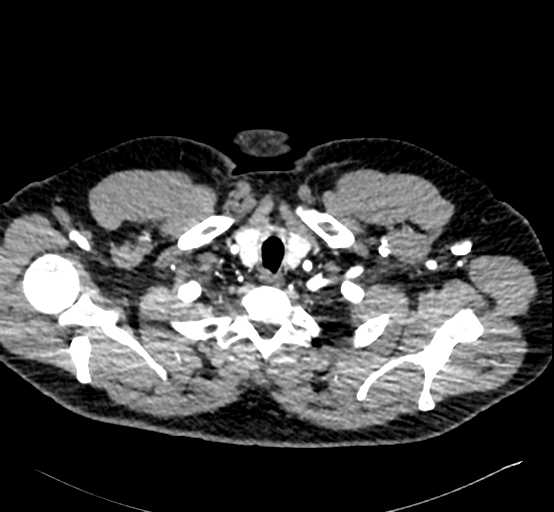

[Series 10: cta thorax 2.00 bv36 s3 cor st · coronal · 0.61mm/px · 1 of 187 slices shown]
[im 94/187  mediastinal]
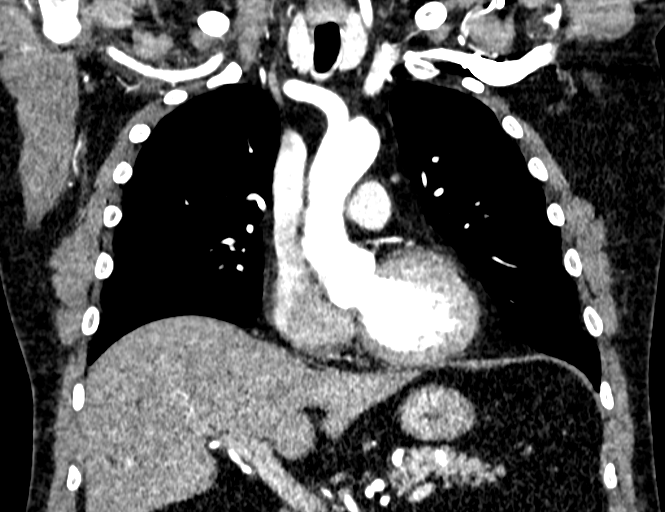

[13 of 36 positions shown; findings below may reference images not displayed]

FINDINGS: Cardiovascular: Heart size normal. No pericardial effusion. The RV
is nondilated. Fair contrast opacification of the pulmonary arterial
tree; the exam was not optimized for detection of pulmonary emboli.
Good contrast opacification of the thoracic aorta, without
dissection or stenosis.

Aortic Root:

--Valve: 2.5 cm

--Sinuses: 3.7 cm

--Sinotubular Junction: 3.2 cm

Limitations by motion: Moderate

Thoracic Aorta:

--Ascending Aorta: 4.1 cm (previously 4.1)

--Aortic Arch: 3.3 cm

--Descending Aorta:

Classic 3 vessel brachiocephalic arterial origin anatomy without
proximal stenosis. No significant atheromatous change.

Mediastinum/Nodes: No mass or adenopathy.

Lungs/Pleura: No pleural effusion. No pneumothorax. Lungs are clear.

Upper Abdomen: 2 small left liver nodules are stable, probably cysts
but incompletely characterized. No acute findings.

Musculoskeletal: No chest wall abnormality. No acute or significant
osseous findings.

Review of the MIP images confirms the above findings.
IMPRESSION: Stable 4.1 cm ascending thoracic aortic aneurysm without
complicating features. Recommend annual imaging followup by CTA or
MRA. This recommendation follows 6525
ACCF/AHA/AATS/ACR/ASA/SCA/MARECIK/LYONN/SAKAI/PIERRE Guidelines for the
Diagnosis and Management of Patients with Thoracic Aortic Disease.
Circulation. 6525; 121: e266-e369

## 2021-12-16 ENCOUNTER — Other Ambulatory Visit: Payer: Self-pay | Admitting: Surgery

## 2021-12-16 DIAGNOSIS — I7121 Aneurysm of the ascending aorta, without rupture: Secondary | ICD-10-CM

## 2022-01-06 ENCOUNTER — Other Ambulatory Visit: Payer: Self-pay | Admitting: *Deleted

## 2022-01-06 MED ORDER — LOSARTAN POTASSIUM-HCTZ 50-12.5 MG PO TABS: 1.0000 | ORAL_TABLET | Freq: Every day | ORAL | 0 refills | Status: DC

## 2022-01-06 MED ORDER — ATORVASTATIN CALCIUM 10 MG PO TABS: 10.0000 mg | ORAL_TABLET | Freq: Every day | ORAL | 0 refills | Status: DC

## 2022-01-27 ENCOUNTER — Ambulatory Visit
Admission: RE | Admit: 2022-01-27 | Discharge: 2022-01-27 | Disposition: A | Discharge status: Home / Self Care | Payer: 59 | Source: Ambulatory Visit | Attending: Surgery | Admitting: Surgery

## 2022-01-27 ENCOUNTER — Ambulatory Visit: Payer: 59 | Admitting: Surgical

## 2022-01-27 VITALS — BP 120/55 | HR 84 | Resp 18 | Ht 69.5 in | Wt 233.0 lb

## 2022-01-27 DIAGNOSIS — I7121 Aneurysm of the ascending aorta, without rupture: Secondary | ICD-10-CM

## 2022-01-27 NOTE — Patient Instructions (Signed)
Ongoing lifestyle and nutrition management. Control of blood pressure with medications and lifestyle management

## 2022-01-27 NOTE — Progress Notes (Signed)
Subjective:     Patient ID: Lee Grant, male    DOB: September 14, 1965, 56 y.o.   MRN: [id number]  Chief Complaint  Patient presents with   Thoracic Aortic Aneurysm    Yearly f/u with CT chest today    HPI Patient is in today for ongoing surveillance of his thoracic aortic aneurysm.  Last year it measured at 4.1 cm.  Today's study reveals that at 4.4.  He remains asymptomatic in this regard.  His blood pressure has been under good control on current medication regimen.  He is trying to incorporate a better exercise and diet program into his daily activities.  He did have a echocardiogram in 2019 which showed that aortic valve could not exclude bicuspid morphology.  He does have a brother who had a bicuspid aortic valve.  Review of Systems  Constitutional: Negative.   Respiratory: Negative.    Cardiovascular: Negative.         Objective:    BP (!) 120/55 (BP Location: Left Arm, Patient Position: Sitting)   Pulse 84   Resp 18   Ht 5' 9.5" (1.765 m)   Wt 233 lb (105.7 kg)   SpO2 97% Comment: RA  BMI 33.91 kg/m  BP Readings from Last 3 Encounters:  01/27/22 (!) 120/55  10/10/20 130/82  01/31/20 (!) 132/86   Wt Readings from Last 3 Encounters:  01/27/22 233 lb (105.7 kg)  10/10/20 234 lb 9.6 oz (106.4 kg)  01/31/20 (!) 221 lb (100.2 kg)   SpO2 Readings from Last 3 Encounters:  01/27/22 97%  10/10/20 95%  01/31/20 97%      Physical Exam Constitutional:      General: He is not in acute distress.    Appearance: Normal appearance. He is not ill-appearing.  HENT:     Head: Normocephalic and atraumatic.  Eyes:     Extraocular Movements: Extraocular movements intact.     Pupils: Pupils are equal, round, and reactive to light.  Cardiovascular:     Rate and Rhythm: Normal rate and regular rhythm.     Heart sounds: No murmur heard.    No gallop.  Pulmonary:     Effort: Pulmonary effort is normal.     Breath sounds: Normal breath sounds.  Abdominal:      Palpations: Abdomen is soft.     Tenderness: There is no abdominal tenderness.     Comments: Rotund  Musculoskeletal:     Right lower leg: No edema.     Left lower leg: No edema.  Skin:    General: Skin is warm and dry.     Coloration: Skin is not jaundiced or pale.  Neurological:     General: No focal deficit present.     Mental Status: He is alert.  Psychiatric:        Mood and Affect: Mood normal.        Thought Content: Thought content normal.        Judgment: Judgment normal.     No results found for any visits on 01/27/22.      Assessment & Plan:   Problem List Items Addressed This Visit   None Visit Diagnoses     Aneurysm of ascending aorta without rupture ([place])    -  Primary     CT Chest Wo Contrast  Result Date: 01/27/2022 CLINICAL DATA:  Thoracic aortic aneurysm follow-up EXAM: CT CHEST WITHOUT CONTRAST TECHNIQUE: Multidetector CT imaging of the chest was performed following the standard protocol  without IV contrast. RADIATION DOSE REDUCTION: This exam was performed according to the departmental dose-optimization program which includes automated exposure control, adjustment of the mA and/or kV according to patient size and/or use of iterative reconstruction technique. COMPARISON:  01/22/2021 FINDINGS: Cardiovascular: There is fusiform dilation of ascending thoracic aorta measuring 4.4 cm. Ascending thoracic aorta measured 4.1 cm in the previous study. Vascular structures are otherwise unremarkable. Mediastinum/Nodes: No significant lymphadenopathy seen. Lungs/Pleura: There is no focal pulmonary consolidation. There are no discrete lung nodules. There is no pleural effusion or pneumothorax. Upper Abdomen: There are a few small low-density lesions and liver each measuring less than 10 mm with no interval change suggesting cysts or hemangiomas. Musculoskeletal: No acute findings are seen. IMPRESSION: There is fusiform aneurysm in ascending thoracic aorta measuring 4.4 cm.  There is slight increase in size since 01/22/2021. Recommend annual imaging followup by CTA or MRA. This recommendation follows 2010 ACCF/AHA/AATS/ACR/ASA/SCA/SCAI/SIR/STS/SVM Guidelines for the Diagnosis and Management of Patients with Thoracic Aortic Disease. Circulation. 2010; 121: P929-W446. Aortic aneurysm NOS (ICD10-I71.9) There are a few low-density foci in the liver, possibly cysts or hemangiomas. Electronically Signed   By: Elmer Picker M.D.   On: 01/27/2022 12:35     No orders of the defined types were placed in this encounter.  The patient is very stable although there is  increase in size in the aortic aneurysm.  We will get a repeat CTA of the chest in 1 year.  Have encouraged him to continue to monitor his blood pressure and be aware of any worrisome symptoms in regards to activity such as chest pain or shortness of breath.  He continues to be followed by cardiology.  No follow-ups on file.  John Giovanni, PA-C

## 2022-02-05 ENCOUNTER — Other Ambulatory Visit: Payer: Self-pay

## 2022-02-05 MED ORDER — METOPROLOL SUCCINATE ER 25 MG PO TB24: 25.0000 mg | ORAL_TABLET | Freq: Every day | ORAL | 0 refills | Status: DC

## 2022-02-11 NOTE — Progress Notes (Signed)
Cardiology Office Note:    Date:  02/12/2022   ID:  Lee Grant, DOB 29-Oct-1965, MRN [id number]  PCP:  Kristen Loader, FNP  Cardiologist:  Sinclair Grooms, MD   Referring MD: Kristen Loader, FNP   Chief Complaint  Patient presents with   Hypertension   Cardiac Valve Problem    Bicuspid   Thoracic Aortic Aneurysm    History of Present Illness:    Lee Grant is a 56 y.o. male with a hx of essential hypertension, dilated aortic root now at 4.4 X 4.4 cm-> 4.1 cm 2022, bicuspid aortic valve, with suspicion of bicuspid valve related aortopathy.   No chest pain, dyspnea, palpitations, syncope, or other CV complaints.  Past Medical History:  Diagnosis Date   Aortic root dilatation ([place])    Bicuspid aortic valve    Hypertension     History reviewed. No pertinent surgical history.  Current Medications: Current Meds  Medication Sig   atorvastatin (LIPITOR) 10 MG tablet Take 1 tablet (10 mg total) by mouth daily.   buPROPion (WELLBUTRIN XL) 300 MG 24 hr tablet Take 300 mg by mouth every morning.   losartan-hydrochlorothiazide (HYZAAR) 50-12.5 MG tablet Take 1 tablet by mouth daily. Please keep upcoming appt in April 2022 with Dr. Tamala Julian for future refills. Thank you   metoprolol succinate (TOPROL-XL) 25 MG 24 hr tablet Take 1 tablet (25 mg total) by mouth daily. Please keep upcoming appt. With Dr. Tamala Julian in Aug. In order to receive future refills. Thank You.     Allergies:   Quinolones   Social History   Socioeconomic History   Marital status: Married    Spouse name: Not on file   Number of children: Not on file   Years of education: Not on file   Highest education level: Not on file  Occupational History   Not on file  Tobacco Use   Smoking status: Former   Smokeless tobacco: Never  Substance and Sexual Activity   Alcohol use: Not on file   Drug use: Not on file   Sexual activity: Not on file  Other Topics Concern   Not on file  Social History  Narrative   Not on file   Social Determinants of Health   Financial Resource Strain: Not on file  Food Insecurity: Not on file  Transportation Needs: Not on file  Physical Activity: Not on file  Stress: Not on file  Social Connections: Not on file     Family History: The patient's family history includes Aneurysm in his father and mother; Healthy in his sister; Hypotension in his mother; Other in his brother.  ROS:   Please see the history of present illness.    No complaints all other systems reviewed and are negative.  EKGs/Labs/Other Studies Reviewed:    The following studies were reviewed today:  CT Chest with contrast 01/22/2021: IMPRESSION: Stable 4.1 cm ascending thoracic aortic aneurysm without complicating features. Recommend annual imaging followup by CTA or MRA. This recommendation follows 2010 ACCF/AHA/AATS/ACR/ASA/SCA/SCAI/SIR/STS/SVM Guidelines for the Diagnosis and Management of Patients with Thoracic Aortic Disease. Circulation. 2010; 121: C789-F810  EKG:  EKG sinus rhythm with QS pattern V1 and V2.  Otherwise normal. When compared to April 2022, no changes noted.  Recent Labs: No results found for requested labs within last 365 days.  Recent Lipid Panel No results found for: "CHOL", "TRIG", "HDL", "CHOLHDL", "VLDL", "LDLCALC", "LDLDIRECT"  Physical Exam:    VS:  BP 126/84   Pulse 64  Ht 5' 9.5" (1.765 m)   Wt 234 lb 9.6 oz (106.4 kg)   SpO2 97%   BMI 34.15 kg/m     Wt Readings from Last 3 Encounters:  02/12/22 234 lb 9.6 oz (106.4 kg)  01/27/22 233 lb (105.7 kg)  10/10/20 234 lb 9.6 oz (106.4 kg)     GEN: Overweight. No acute distress HEENT: Normal NECK: No JVD. LYMPHATICS: No lymphadenopathy CARDIAC: No murmur. RRR no gallop, or edema. VASCULAR:  Normal Pulses. No bruits. RESPIRATORY:  Clear to auscultation without rales, wheezing or rhonchi  ABDOMEN: Soft, non-tender, non-distended, No pulsatile mass, MUSCULOSKELETAL: No deformity   SKIN: Warm and dry NEUROLOGIC:  Alert and oriented x 3 PSYCHIATRIC:  Normal affect   ASSESSMENT:    1. Bicuspid aortic valve   2. Dilated aortic root ([place])   3. Essential hypertension    PLAN:    In order of problems listed above:  2D Doppler echocardiogram 1 month prior to follow-up appointment in 1 year.  No imaging done this year. No repeat imaging done this year.  Will do echocardiogram in 1 year. Continue Hyzaar 50/12.5 mg daily and Toprol-XL 25 mg/day.  Follow-up in 1 year with new provider.  2D Doppler echocardiogram in front of the office visit.   Medication Adjustments/Labs and Tests Ordered: Current medicines are reviewed at length with the patient today.  Concerns regarding medicines are outlined above.  No orders of the defined types were placed in this encounter.  No orders of the defined types were placed in this encounter.   There are no Patient Instructions on file for this visit.   Signed, Sinclair Grooms, MD  02/12/2022 4:51 PM    [facility]

## 2022-02-12 ENCOUNTER — Encounter: Payer: Self-pay | Admitting: Interventional Cardiology

## 2022-02-12 ENCOUNTER — Ambulatory Visit: Payer: 59 | Admitting: Interventional Cardiology

## 2022-02-12 VITALS — BP 126/84 | HR 64 | Ht 69.5 in | Wt 234.6 lb

## 2022-02-12 DIAGNOSIS — Q231 Congenital insufficiency of aortic valve: Secondary | ICD-10-CM

## 2022-02-12 DIAGNOSIS — I77810 Thoracic aortic ectasia: Secondary | ICD-10-CM

## 2022-02-12 DIAGNOSIS — I10 Essential (primary) hypertension: Secondary | ICD-10-CM | POA: Diagnosis not present

## 2022-02-12 NOTE — Patient Instructions (Addendum)
Medication Instructions:  Your physician recommends that you continue on your current medications as directed. Please refer to the Current Medication list given to you today.  *If you need a refill on your cardiac medications before your next appointment, please call your pharmacy*  Lab Work: NONE  Testing/Procedures: Your physician has requested that you have an echocardiogram in June 2024 prior to office visit in July 2024. Echocardiography is a painless test that uses sound waves to create images of your heart. It provides your doctor with information about the size and shape of your heart and how well your heart's chambers and valves are working. This procedure takes approximately one hour. There are no restrictions for this procedure.   Follow-Up: At [facility], you and your health needs are our priority.  As part of our continuing mission to provide you with exceptional heart care, we have created designated Provider Care Teams.  These Care Teams include your primary Cardiologist (physician) and Advanced Practice Providers (APPs -  Physician Assistants and Nurse Practitioners) who all work together to provide you with the care you need, when you need it.  Your next appointment:   1 year(s)  The format for your next appointment:   In Person  Provider:   Sinclair Grooms, MD {   Important Information About Sugar

## 2022-05-10 ENCOUNTER — Other Ambulatory Visit: Payer: Self-pay

## 2022-05-10 MED ORDER — LOSARTAN POTASSIUM-HCTZ 50-12.5 MG PO TABS: 1.0000 | ORAL_TABLET | Freq: Every day | ORAL | 3 refills | Status: DC

## 2022-05-10 MED ORDER — ATORVASTATIN CALCIUM 10 MG PO TABS: 10.0000 mg | ORAL_TABLET | Freq: Every day | ORAL | 3 refills | Status: DC

## 2022-05-10 NOTE — Addendum Note (Signed)
Addended by: Carter Kitten D on: 05/10/2022 09:04 AM   Modules accepted: Orders

## 2022-07-16 ENCOUNTER — Other Ambulatory Visit: Payer: Self-pay

## 2022-07-16 MED ORDER — METOPROLOL SUCCINATE ER 25 MG PO TB24: 25.0000 mg | ORAL_TABLET | Freq: Every day | ORAL | 0 refills | Status: DC

## 2022-08-20 ENCOUNTER — Other Ambulatory Visit: Payer: Self-pay

## 2022-08-20 MED ORDER — METOPROLOL SUCCINATE ER 25 MG PO TB24: 25.0000 mg | ORAL_TABLET | Freq: Every day | ORAL | 2 refills | Status: DC

## 2022-12-16 ENCOUNTER — Other Ambulatory Visit: Payer: Self-pay | Admitting: Surgery

## 2022-12-16 DIAGNOSIS — I7121 Aneurysm of the ascending aorta, without rupture: Secondary | ICD-10-CM

## 2022-12-24 ENCOUNTER — Ambulatory Visit (HOSPITAL_COMMUNITY): Payer: 59 | Attending: Cardiology

## 2022-12-24 DIAGNOSIS — Q231 Congenital insufficiency of aortic valve: Secondary | ICD-10-CM

## 2022-12-24 LAB — ECHOCARDIOGRAM COMPLETE
AR max vel: 1.84 cm2
AV Area VTI: 1.96 cm2
AV Area mean vel: 1.97 cm2
AV Mean grad: 11 mmHg
AV Peak grad: 22.4 mmHg
Ao pk vel: 2.37 m/s
Area-P 1/2: 3.19 cm2
S' Lateral: 3.2 cm

## 2022-12-27 ENCOUNTER — Telehealth: Payer: Self-pay | Admitting: Interventional Cardiology

## 2022-12-27 NOTE — Telephone Encounter (Signed)
Left message for patient. Echocardiogram results received, need to be review by provider. Former Dr. Katrinka Blazing patient, patient is due for follow-up in July (will need to be scheduled for 1 year F/U with new cardiologist or APP). Need to know who he is scheduled with for them to review his echo results.  Requested patient callback to schedule follow-up appt due in July 2024.

## 2023-01-13 NOTE — Progress Notes (Deleted)
Cardiology Office Note:    Date:  01/13/2023   ID:  Lee Grant, DOB 08/08/1965, MRN [id number]  PCP:  Soundra Pilon, FNP  Cardiologist:  Prior Katrinka Blazing new to me Eden Emms   Referring MD: Soundra Pilon, FNP      History of Present Illness:    Lee Grant is a 57 y.o. male with a hx of essential hypertension, dilated aortic root now at 4.4 X 4.4 cm-> 4.1 cm 2022, bicuspid aortic valve, with suspicion of bicuspid valve related aortopathy. Has a brother with bicuspid AV.    No chest pain, dyspnea, palpitations, syncope, or other CV complaints.  CTA 01/22/21 stable 4.1 cm ascending thoracic aorta  CT non contrast 01/27/22 aorta 4.4 cm  TTE 12/24/22 AV not well seen mean gradient 11 mmHg aorta 42 mm  ***   Past Medical History:  Diagnosis Date   Aortic root dilatation (HCC)    Bicuspid aortic valve    Hypertension     No past surgical history on file.  Current Medications: No outpatient medications have been marked as taking for the 01/27/23 encounter (Appointment) with Wendall Stade, MD.     Allergies:   Quinolones   Social History   Socioeconomic History   Marital status: Married    Spouse name: Not on file   Number of children: Not on file   Years of education: Not on file   Highest education level: Not on file  Occupational History   Not on file  Tobacco Use   Smoking status: Former   Smokeless tobacco: Never  Substance and Sexual Activity   Alcohol use: Not on file   Drug use: Not on file   Sexual activity: Not on file  Other Topics Concern   Not on file  Social History Narrative   Not on file   Social Determinants of Health   Financial Resource Strain: Not on file  Food Insecurity: Not on file  Transportation Needs: Not on file  Physical Activity: Not on file  Stress: Not on file  Social Connections: Not on file     Family History: The patient's family history includes Aneurysm in his father and mother; Healthy in his sister;  Hypotension in his mother; Other in his brother.  ROS:   Please see the history of present illness.    No complaints all other systems reviewed and are negative.  EKGs/Labs/Other Studies Reviewed:    The following studies were reviewed today:  CT Chest with contrast 01/22/2021: IMPRESSION: Stable 4.1 cm ascending thoracic aortic aneurysm without complicating features. Recommend annual imaging followup by CTA or MRA. This recommendation follows 2010 ACCF/AHA/AATS/ACR/ASA/SCA/SCAI/SIR/STS/SVM Guidelines for the Diagnosis and Management of Patients with Thoracic Aortic Disease. Circulation. 2010; 121: Q469-G295  EKG:  EKG sinus rhythm with QS pattern V1 and V2.  Otherwise normal. When compared to April 2022, no changes noted.  Recent Labs: No results found for requested labs within last 365 days.  Recent Lipid Panel No results found for: "CHOL", "TRIG", "HDL", "CHOLHDL", "VLDL", "LDLCALC", "LDLDIRECT"  Physical Exam:    VS:  There were no vitals taken for this visit.    Wt Readings from Last 3 Encounters:  02/12/22 234 lb 9.6 oz (106.4 kg)  01/27/22 233 lb (105.7 kg)  10/10/20 234 lb 9.6 oz (106.4 kg)     Affect appropriate Healthy:  appears stated age HEENT: normal Neck supple with no adenopathy JVP normal no bruits no thyromegaly Lungs clear with no wheezing and good diaphragmatic  motion Heart:  S1/S2 *** murmur, no rub, gallop or click PMI normal Abdomen: benighn, BS positve, no tenderness, no AAA no bruit.  No HSM or HJR Distal pulses intact with no bruits No edema Neuro non-focal Skin warm and dry No muscular weakness  Impression:  AVdx:  no definitive diagnosis of bicuspid valve on prior echos Brother with bicuspid valve. Aortopathy with dilated ascending thoracic aorta increase in size 4.1 to 4.4 cm in July 2023 on non contrast CT Shared decision making favor gated TAVR type cardiac CTA.  Will measure sinuses , root and ascending aorta R/O coarctation. Assess  AV morphology and calcium score and assess CAD for calcium score and degree of CAD HTN: continue Toprol and Hyzaar DASH diet stable HLD continue lipitor labs with primary Coronary calcium score will guide agressiveness of LDL control   Garted cardiac CTA TAVR protocol with no abdomen / pelvis Scan clavicles down Take his oral Toprol 2 hours prior and nitroglycerin for exam  F/U in a year pending results   Signed, Charlton Haws, MD  01/13/2023 2:47 PM    [facility]

## 2023-01-25 ENCOUNTER — Other Ambulatory Visit: Payer: Self-pay | Admitting: *Deleted

## 2023-01-25 MED ORDER — LOSARTAN POTASSIUM-HCTZ 50-12.5 MG PO TABS: 1.0000 | ORAL_TABLET | Freq: Every day | ORAL | 3 refills | Status: AC

## 2023-01-25 MED ORDER — ATORVASTATIN CALCIUM 10 MG PO TABS: 10.0000 mg | ORAL_TABLET | Freq: Every day | ORAL | 3 refills | Status: DC

## 2023-01-27 ENCOUNTER — Ambulatory Visit: Payer: 59 | Admitting: Cardiovascular Disease

## 2023-01-31 ENCOUNTER — Ambulatory Visit: Payer: 59

## 2023-01-31 ENCOUNTER — Other Ambulatory Visit: Payer: Self-pay

## 2023-02-23 ENCOUNTER — Ambulatory Visit
Admission: RE | Admit: 2023-02-23 | Discharge: 2023-02-23 | Disposition: A | Discharge status: Home / Self Care | Payer: 59 | Source: Ambulatory Visit | Attending: Surgery | Admitting: Surgery

## 2023-02-23 DIAGNOSIS — I7121 Aneurysm of the ascending aorta, without rupture: Secondary | ICD-10-CM

## 2023-02-23 MED ORDER — IOPAMIDOL (ISOVUE-370) INJECTION 76%
75.0000 mL | Freq: Once | INTRAVENOUS | Status: AC | PRN
  Administered 2023-02-23: 75 mL via INTRAVENOUS

## 2023-02-28 ENCOUNTER — Other Ambulatory Visit: Payer: Self-pay | Admitting: Surgery

## 2023-02-28 ENCOUNTER — Ambulatory Visit: Payer: 59 | Admitting: Surgical

## 2023-02-28 ENCOUNTER — Other Ambulatory Visit: Payer: Self-pay

## 2023-02-28 VITALS — BP 138/84 | HR 80 | Resp 20 | Ht 69.5 in | Wt 233.0 lb

## 2023-02-28 DIAGNOSIS — I7121 Aneurysm of the ascending aorta, without rupture: Secondary | ICD-10-CM

## 2023-02-28 NOTE — Progress Notes (Signed)
Subjective:     Patient ID: Lee Grant, male    DOB: 1966-06-17, 57 y.o.   MRN: [id number]  Chief Complaint  Patient presents with   Thoracic Aortic Aneurysm    1 year f/u after CTA Chest 02/23/23    HPI Patient is in today for ongoing follow-up of his ascending thoracic aneurysm.  Last year it measured approximately 4.4 cm in diameter.  This year scan actually shows it to be in the 4.0-4.1 range.  Could be a technical issue from last year's scan to make up the difference but is not completely certain.  Remains asymptomatic and feels well overall.  He denies chest pain or shortness of breath.  He denies claudication or calf discomfort..  Interestingly enough he did show a small right lower lobe subsegmental small pulmonary embolus.  This appears to be an incidental finding although it is noted that he had a recent flight to [facility] in July.  He is moderately overweight.  His blood pressure is mild to moderately elevated today but it has been under good control.   Patient Active Problem List   Diagnosis Date Noted   Essential hypertension 01/26/2017   Bicuspid aortic valve 01/25/2017   Dilated aortic root (HCC) 01/25/2017      Past Medical History:  Diagnosis Date   Aortic root dilatation (HCC)    Bicuspid aortic valve    Hypertension     No past surgical history on file.    Current Outpatient Medications  Medication Instructions   atorvastatin (LIPITOR) 10 mg, Oral, Daily   buPROPion (WELLBUTRIN XL) 300 mg, Oral, Every morning   losartan-hydrochlorothiazide (HYZAAR) 50-12.5 MG tablet 1 tablet, Oral, Daily   metoprolol succinate (TOPROL-XL) 25 mg, Oral, Daily     Allergies  Allergen Reactions   Quinolones     Patient was warned about not using Cipro and similar antibiotics. Recent studies have raised concern that fluoroquinolone antibiotics could be associated with an increased risk of aortic aneurysm Fluoroquinolones have non-antimicrobial properties that  might jeopardise the integrity of the extracellular matrix of the vascular wall In a  propensity score matched cohort study in [place], there was a 66% increased rate of aortic aneurysm or dissection associated with oral fluoroquinolone use, compared wit      Social History   Occupational History   Not on file  Tobacco Use   Smoking status: Former   Smokeless tobacco: Never  Substance and Sexual Activity   Alcohol use: Not on file   Drug use: Not on file   Sexual activity: Not on file         Objective:    There were no vitals taken for this visit. BP Readings from Last 3 Encounters:  02/28/23 138/84  02/12/22 126/84  01/27/22 (!) 120/55   Wt Readings from Last 3 Encounters:  02/28/23 233 lb (105.7 kg)  02/12/22 234 lb 9.6 oz (106.4 kg)  01/27/22 233 lb (105.7 kg)      Physical Exam Vitals reviewed.  Constitutional:      General: He is not in acute distress.    Appearance: Normal appearance. He is not ill-appearing.  HENT:     Head: Normocephalic and atraumatic.  Eyes:     General: No scleral icterus. Cardiovascular:     Rate and Rhythm: Normal rate and regular rhythm.     Pulses: Normal pulses.     Heart sounds: No murmur heard. Pulmonary:     Effort: Pulmonary effort is  normal.     Breath sounds: Normal breath sounds.  Abdominal:     Palpations: There is no mass.     Comments: Protuberant  Musculoskeletal:     Cervical back: Normal range of motion and neck supple.     Comments: Minor bilateral lower extremity edema  Neurological:     Mental Status: He is alert and oriented to person, place, and time.  Psychiatric:        Thought Content: Thought content normal.     No results found for any visits on 02/28/23. Narrative & Impression  CLINICAL DATA:  Ascending aortic aneurysm.   EXAM: CT ANGIOGRAPHY CHEST WITH CONTRAST   TECHNIQUE: Multidetector CT imaging of the chest was performed using the standard protocol during bolus administration of  intravenous contrast. Multiplanar CT image reconstructions and MIPs were obtained to evaluate the vascular anatomy.   RADIATION DOSE REDUCTION: This exam was performed according to the departmental dose-optimization program which includes automated exposure control, adjustment of the mA and/or kV according to patient size and/or use of iterative reconstruction technique.   CONTRAST:  75mL ISOVUE-370 IOPAMIDOL (ISOVUE-370) INJECTION 76%   COMPARISON:  Chest CT dated 01/27/2022.   FINDINGS: Cardiovascular: There is no cardiomegaly or pericardial effusion. Mild aneurysmal dilatation of the ascending aorta measuring up to 4 cm in diameter on today's exam. No aortic dissection. The origins of the great vessels of the aortic arch appear patent. There is a faint intraluminal filling defect involving the segmental branch of the right lower lobe (94/6 and coronal 62/9) suspicious for a small pulmonary artery embolus. Evaluation however is limited due to timing of the contrast and suboptimal opacification.   Mediastinum/Nodes: No hilar or mediastinal adenopathy. The esophagus is grossly unremarkable. No mediastinal fluid collection.   Lungs/Pleura: The lungs are clear. There is no pleural effusion or pneumothorax. The central airways are patent.   Upper Abdomen: Indeterminate 1 cm hypodense lesion in the left lobe of the liver similar to prior CT, likely a cyst.   Musculoskeletal: No acute osseous pathology.   Review of the MIP images confirms the above findings.   IMPRESSION: 1. Mild aneurysmal dilatation of the ascending aorta measuring up to 4 cm in diameter. Recommend annual imaging followup by CTA or MRA. This recommendation follows 2010 ACCF/AHA/AATS/ACR/ASA/SCA/SCAI/SIR/STS/SVM Guidelines for the Diagnosis and Management of Patients with Thoracic Aortic Disease. Circulation. 2010; 121: J478-G956. Aortic aneurysm NOS (ICD10-I71.9) 2. Suspected small pulmonary artery embolus  involving the segmental branch of the right lower lobe. 3. No acute intrathoracic pathology.   These results were called by telephone at the time of interpretation on 02/23/2023 at 4:30 pm to provider Evelene Croon , who verbally acknowledged these results.     Electronically Signed   By: Elgie Collard M.D.   On: 02/23/2023 17:00       Assessment & Plan:   Aneurysm is stable to decreased in size compared to previous exam and close to findings of 2 years ago.  Due to his recent travel and the findings of subsegmental pulmonary embolism will obtain a venous duplex to try to determine this is a somewhat acute finding.  He may require anticoagulation if this is positive.  I did discuss the initial CT findings with Dr. Laneta Simmers.  We will also repeat a chest CTA in 1 year to continue the ongoing surveillance of the aneurysm.     Problem List Items Addressed This Visit   None Visit Diagnoses     Aneurysm of ascending  aorta without rupture (HCC)    -  Primary       No orders of the defined types were placed in this encounter.   No follow-ups on file.  Rowe Clack, PA-C

## 2023-02-28 NOTE — Patient Instructions (Signed)
Obtain venous duplex and we will contact you with findings/results and next steps in regard to potential anticoagulation

## 2023-03-01 ENCOUNTER — Ambulatory Visit (HOSPITAL_COMMUNITY)
Admission: RE | Admit: 2023-03-01 | Discharge: 2023-03-01 | Disposition: A | Discharge status: Home / Self Care | Payer: 59 | Source: Ambulatory Visit | Attending: Surgery | Admitting: Surgery

## 2023-03-01 DIAGNOSIS — I7121 Aneurysm of the ascending aorta, without rupture: Secondary | ICD-10-CM | POA: Diagnosis present

## 2023-03-01 NOTE — Progress Notes (Signed)
Bilateral lower extremity venous duplex has been completed. Preliminary results can be found in CV Proc through chart review.  Results were faxed to Dr. Laneta Simmers.  03/01/23 10:05 AM Olen Cordial RVT

## 2023-03-31 ENCOUNTER — Telehealth: Payer: Self-pay | Admitting: *Deleted

## 2023-03-31 NOTE — Telephone Encounter (Signed)
Pre-operative Risk Assessment    Patient Name: Lee Grant  DOB: 1965/12/18 MRN: [id number]  LAST OV : 02-12-22 NEW OV: 04-19-23  NAHSER   Request for Surgical Clearance    Procedure:   COLONOSCOPY  Date of Surgery:  Clearance TBD                                  Surgeon:  DR Bernette Redbird Surgeon's Group or Practice Name:  [facility] Phone number:  [phone] Fax number:  [phone]  Type of Clearance Requested:   - Medical    Type of Anesthesia:   PROPOFOL   Additional requests/questions:   N/A  Freddy Jaksch   03/31/2023, 9:43 AM

## 2023-03-31 NOTE — Telephone Encounter (Signed)
Name: Lee Grant  DOB: Feb 05, 1966  MRN: [id number]  Primary Cardiologist: Lesleigh Noe, MD (Inactive)  Chart reviewed as part of pre-operative protocol coverage. Because of [name]'s past medical history and time since last visit, he will require a follow-up in-office visit in order to better assess preoperative cardiovascular risk.  Patient has an office visit scheduled with Dr. Elease Hashimoto on 04/19/23. Appointment notes have been updated to reflect need for pre-op evaluation.   Pre-op covering staff: - Please contact requesting surgeon's office via preferred method (i.e, phone, fax) to inform them of need for appointment prior to surgery.  Rip Harbour, NP  03/31/2023, 11:02 AM

## 2023-04-18 ENCOUNTER — Encounter: Payer: Self-pay | Admitting: Cardiovascular Disease

## 2023-04-18 NOTE — Progress Notes (Unsigned)
  Cardiology Office Note:  .   Date:  04/19/2023  ID:  Lee Grant, DOB 09/16/1965, MRN [id number] PCP: Soundra Pilon, FNP  [facility] Providers Cardiologist:  Lesleigh Noe, MD (Inactive) ,  Katrinka Blazing, now Florence Yeung   Click to update primary MD,subspecialty MD or APP then REFRESH:1}   History of Present Illness: .   Lee Grant is a 57 y.o. male with hx of HTN, ascending aortic aneurism , bicuspid AV   His most recent echo was December 24, 2022.  He has normal left ventricular systolic function with EF of 60 to 65%.  He has mild concentric LVH.  Diastolic filling was normal. He had a bicuspid valve appears to be stable.    He has an mean  aortic valve gradient of 11 mmHg.  Lee Grant   He is scheduled to have a colonoscopy in several weeks.  He is at low risk for his upcoming colonoscopy.  He is not on any blood thinners.  Walks 2 miles a day  Works as a Energy manager   No Cp , no dyspnea   Non smoker   Brother also has a bicuspid AV      ROS:   Studies Reviewed: .         Risk Assessment/Calculations:             Physical Exam:   VS:  BP 118/64   Pulse 64   Ht 5' 9.5" (1.765 m)   Wt 237 lb 9.6 oz (107.8 kg)   SpO2 95%   BMI 34.58 kg/m    Wt Readings from Last 3 Encounters:  04/19/23 237 lb 9.6 oz (107.8 kg)  02/28/23 233 lb (105.7 kg)  02/12/22 234 lb 9.6 oz (106.4 kg)    QIO:[id number] obese male,  in no acute distress NECK: No JVD; No carotid bruits CARDIAC: RR,  very soft systolic murmur  RESPIRATORY:  Clear to auscultation without rales, wheezing or rhonchi  ABDOMEN: Soft, non-tender, non-distended EXTREMITIES:  No edema; No deformity   ASSESSMENT AND PLAN: .     1.  Bicuspid aortic valve: Markeith presents for follow-up of his bicuspid aortic valve.  His valve is slightly calcified but there is no significant aortic stenosis or aortic insufficiency.  This is associated with mild dilatation of his ascending aorta.  At this time I  do not think that he needs to be considered for surgery.  The valve is functioning fairly well and his aorta is not markedly dilated.  Will have him follow-up with [place] in 1 year.  He is at low risk for his upcoming colonoscopy.  He is not on any blood thinners.       Dispo: 1 year with Dr. Royann Shivers  Signed, Lee Miss, MD

## 2023-04-19 ENCOUNTER — Encounter: Payer: Self-pay | Admitting: Cardiovascular Disease

## 2023-04-19 ENCOUNTER — Ambulatory Visit: Payer: 59 | Attending: Cardiovascular Disease | Admitting: Cardiovascular Disease

## 2023-04-19 VITALS — BP 118/64 | HR 64 | Ht 69.5 in | Wt 237.6 lb

## 2023-04-19 DIAGNOSIS — Q2381 Bicuspid aortic valve: Secondary | ICD-10-CM | POA: Diagnosis not present

## 2023-04-19 DIAGNOSIS — Z01818 Encounter for other preprocedural examination: Secondary | ICD-10-CM | POA: Diagnosis not present

## 2023-04-19 NOTE — Telephone Encounter (Signed)
Revanth has a stable bicuspid aortic valve. He is at low risk for his upcoming colonoscopy.  He is not on any blood thinners.    [name], MD  04/19/2023 4:22 PM    [facility] [address] [place],  [address] [place], [place]  [address] Phone: [phone]; Fax: [phone]

## 2023-04-19 NOTE — Patient Instructions (Signed)
Medication Instructions:  Your physician recommends that you continue on your current medications as directed. Please refer to the Current Medication list given to you today.  *If you need a refill on your cardiac medications before your next appointment, please call your pharmacy*   Lab Work: NONE If you have labs (blood work) drawn today and your tests are completely normal, you will receive your results only by: MyChart Message (if you have MyChart) OR A paper copy in the mail If you have any lab test that is abnormal or we need to change your treatment, we will call you to review the results.   Testing/Procedures: NONE   Follow-Up: At [facility], you and your health needs are our priority.  As part of our continuing mission to provide you with exceptional heart care, we have created designated Provider Care Teams.  These Care Teams include your primary Cardiologist (physician) and Advanced Practice Providers (APPs -  Physician Assistants and Nurse Practitioners) who all work together to provide you with the care you need, when you need it.   Your next appointment:   1 year(s)  Provider:   Thurmon Fair, MD

## 2023-04-19 NOTE — Telephone Encounter (Signed)
   Patient Name: Lee Grant  DOB: [birth year] MRN: [id number]  Primary Cardiologist: Lesleigh Noe, MD (Inactive)  Chart reviewed as part of pre-operative protocol coverage. Given past medical history and time since last visit, based on ACC/AHA guidelines, Lee Grant is at acceptable risk for the planned procedure without further cardiovascular testing.   Per Dr. Vicente Masson has a stable bicuspid aortic valve. He is at low risk for his upcoming colonoscopy.  He is not on any blood thinners.  The patient was advised that if he develops new symptoms prior to surgery to contact our office to arrange for a follow-up visit, and he verbalized understanding.  I will route this recommendation to the requesting party via Epic fax function and remove from pre-op pool.  Please call with questions.  Joni Reining, NP 04/19/2023, 4:26 PM

## 2023-05-31 ENCOUNTER — Other Ambulatory Visit: Payer: Self-pay

## 2023-05-31 DIAGNOSIS — I10 Essential (primary) hypertension: Secondary | ICD-10-CM

## 2023-05-31 MED ORDER — METOPROLOL SUCCINATE ER 25 MG PO TB24
25.0000 mg | ORAL_TABLET | Freq: Every day | ORAL | 3 refills | Status: DC
Start: 2023-05-31 — End: 2024-05-28

## 2024-01-23 ENCOUNTER — Other Ambulatory Visit: Payer: Self-pay | Admitting: Surgery

## 2024-01-23 DIAGNOSIS — I7121 Aneurysm of the ascending aorta, without rupture: Secondary | ICD-10-CM

## 2024-02-24 ENCOUNTER — Ambulatory Visit (HOSPITAL_COMMUNITY)
Admission: RE | Admit: 2024-02-24 | Discharge: 2024-02-24 | Disposition: A | Discharge status: Home / Self Care | Source: Ambulatory Visit | Attending: Surgery | Admitting: Surgery

## 2024-02-24 DIAGNOSIS — I712 Thoracic aortic aneurysm, without rupture, unspecified: Secondary | ICD-10-CM | POA: Diagnosis present

## 2024-02-24 DIAGNOSIS — I7121 Aneurysm of the ascending aorta, without rupture: Secondary | ICD-10-CM

## 2024-02-24 MED ORDER — IOHEXOL 350 MG/ML SOLN
75.0000 mL | Freq: Once | INTRAVENOUS | Status: AC | PRN
  Administered 2024-02-24: 75 mL via INTRAVENOUS

## 2024-03-05 NOTE — Progress Notes (Unsigned)
 [address] [name] [address]             [phone]            Lee Grant [id number] 1966/02/08   History of Present Illness:  [name] is a 61 year ole male with medical history of hypertension and hyperlipidemia who presents since for continued surveillance of ascending thoracic aortic aneurysm.  CTA of chest measured aneurysm at 4.4 cm which is stable in size when compared to prior studies.  Echocardiogram showed bicuspid aortic valve.  He has family history of bicuspid aortic valve (brother) and abdominal aneurysms (father).     Current Outpatient Medications on File Prior to Visit  Medication Sig Dispense Refill   atorvastatin  (LIPITOR) 10 MG tablet Take 1 tablet (10 mg total) by mouth daily. 90 tablet 3   buPROPion (WELLBUTRIN XL) 300 MG 24 hr tablet Take 300 mg by mouth every morning.     losartan -hydrochlorothiazide (HYZAAR) 50-12.5 MG tablet Take 1 tablet by mouth daily. 90 tablet 3   metoprolol  succinate (TOPROL -XL) 25 MG 24 hr tablet Take 1 tablet (25 mg total) by mouth daily. 90 tablet 3   No current facility-administered medications on file prior to visit.     ROS: ROS   There were no vitals taken for this visit.  Physical Exam   Imaging: CLINICAL DATA:  Thoracic aortic aneurysm.   EXAM: CT ANGIOGRAPHY CHEST WITH CONTRAST   TECHNIQUE: Multidetector CT imaging of the chest was performed using the standard protocol during bolus administration of intravenous contrast. Multiplanar CT image reconstructions and MIPs were obtained to evaluate the vascular anatomy.   RADIATION DOSE REDUCTION: This exam was performed according to the departmental dose-optimization program which includes automated exposure control, adjustment of the mA and/or kV according to patient size and/or use of iterative reconstruction technique.   CONTRAST:  75mL OMNIPAQUE  IOHEXOL  350 MG/ML SOLN   COMPARISON:  02/23/2023.   01/27/2022   FINDINGS: Cardiovascular: The heart size is normal. No substantial pericardial effusion. Ascending thoracic aorta measures 4.4 cm diameter on image 60/301. Measuring at the same level and in a similar fashion on the most recent comparison study, aorta was 4.1 cm. Reported measurements for ascending thoracic aorta on the 01/27/2022 exam is 4.4 cm.   Mediastinum/Nodes: No mediastinal lymphadenopathy. There is no hilar lymphadenopathy. The esophagus has normal imaging features. There is no axillary lymphadenopathy.   Lungs/Pleura: Tiny calcified granuloma noted right upper lobe. 3 mm left lower lobe nodule on 106/605 is stable since prior study consistent with benign etiology. No followup imaging is recommended. No focal airspace consolidation. There is no evidence of pleural effusion.   Upper Abdomen: A tiny hypodensity in the liver parenchyma is too small to characterize but is statistically most likely benign. No followup imaging is recommended. The visualized portion of the upper abdomen is otherwise unremarkable.   Musculoskeletal: No worrisome lytic or sclerotic osseous abnormality.   Review of the MIP images confirms the above findings.   IMPRESSION: 1. No substantial change in ascending thoracic aortic aneurysm comparing back over two prior studies to 2023.     Electronically Signed   By: Camellia Candle M.D.   On: 02/24/2024 08:50     A/P: Aneurysm of ascending aorta without rupture (HCC) -4.4 cm ascending thoracic aortic aneurysm.  He has a bicuspid aortic valve.  We discussed the natural history and and risk factors for  growth of ascending aortic aneurysms. Discussed recommendations to minimize the risk of further expansion or dissection including careful blood pressure control, avoidance of contact sports and heavy lifting, attention to lipid management.  We covered the importance of smoking ***cessation/staying never user.  The patient does not yet  meet surgical criteria of >5.0cm. The patient is aware of signs and symptoms of aortic dissection and when to present to the emergency department    -Follow up in one year with CTA of chest     Risk Modification:  Statin:  atorvastatin   Smoking cessation instruction/counseling given:  {CHL AMB [facility] SMOKING CESSATION COUNSELING:20758}  Patient was counseled on importance of Blood Pressure Control  They are instructed to contact their Primary Care Physician if they start to have blood pressure readings over 130s/90s. Do not ever stop blood pressure medications on your own, unless instructed by healthcare professional.  Please avoid use of Fluoroquinolones as this can potentially increase your risk of Aortic Rupture and/or Dissection  Patient educated on signs and symptoms of Aortic Dissection, handout also provided in AVS  [name] CHRISTELLA Rough, PA-C 03/05/24

## 2024-03-06 ENCOUNTER — Ambulatory Visit

## 2024-03-06 VITALS — BP 140/92 | HR 70 | Ht 69.0 in | Wt 209.0 lb

## 2024-03-06 DIAGNOSIS — I7121 Aneurysm of the ascending aorta, without rupture: Secondary | ICD-10-CM

## 2024-03-06 NOTE — Patient Instructions (Signed)

## 2024-03-13 ENCOUNTER — Other Ambulatory Visit: Payer: Self-pay | Admitting: Cardiovascular Disease

## 2024-05-28 ENCOUNTER — Other Ambulatory Visit: Payer: Self-pay

## 2024-05-28 DIAGNOSIS — I10 Essential (primary) hypertension: Secondary | ICD-10-CM

## 2024-05-30 ENCOUNTER — Other Ambulatory Visit: Payer: Self-pay | Admitting: Physician Assistant

## 2024-05-30 DIAGNOSIS — I10 Essential (primary) hypertension: Secondary | ICD-10-CM

## 2024-05-30 MED ORDER — METOPROLOL SUCCINATE ER 25 MG PO TB24: 25.0000 mg | ORAL_TABLET | Freq: Every day | ORAL | 0 refills | Status: DC

## 2024-06-22 ENCOUNTER — Other Ambulatory Visit: Payer: Self-pay | Admitting: Family Medicine

## 2024-06-22 DIAGNOSIS — H53143 Visual discomfort, bilateral: Secondary | ICD-10-CM

## 2024-06-26 ENCOUNTER — Other Ambulatory Visit

## 2024-07-20 ENCOUNTER — Other Ambulatory Visit: Payer: Self-pay | Admitting: Physician Assistant

## 2024-07-20 DIAGNOSIS — I10 Essential (primary) hypertension: Secondary | ICD-10-CM

## 2024-08-01 ENCOUNTER — Encounter: Payer: Self-pay | Admitting: Family Medicine

## 2024-08-02 ENCOUNTER — Ambulatory Visit
Admission: RE | Admit: 2024-08-02 | Discharge: 2024-08-02 | Disposition: A | Discharge status: Home / Self Care | Source: Ambulatory Visit | Attending: Family Medicine | Admitting: Family Medicine

## 2024-08-02 DIAGNOSIS — H53143 Visual discomfort, bilateral: Secondary | ICD-10-CM

## 2024-08-06 ENCOUNTER — Ambulatory Visit: Admitting: Neurology

## 2024-08-09 ENCOUNTER — Encounter: Payer: Self-pay | Admitting: Neurology

## 2024-08-09 ENCOUNTER — Telehealth: Payer: Self-pay | Admitting: *Deleted

## 2024-08-09 ENCOUNTER — Ambulatory Visit: Admitting: Neurology

## 2024-08-09 VITALS — BP 148/85 | HR 71 | Ht 69.5 in | Wt 203.8 lb

## 2024-08-09 DIAGNOSIS — R259 Unspecified abnormal involuntary movements: Secondary | ICD-10-CM | POA: Insufficient documentation

## 2024-08-09 DIAGNOSIS — R293 Abnormal posture: Secondary | ICD-10-CM | POA: Diagnosis not present

## 2024-08-09 DIAGNOSIS — G245 Blepharospasm: Secondary | ICD-10-CM

## 2024-08-09 NOTE — Progress Notes (Signed)
 "  Chief Complaint  Patient presents with   New Patient (Initial Visit)    Pt in room 15. Alone. New patient here for photophobia.      ASSESSMENT AND PLAN  Lee Grant is a 59 y.o. male   Few months history of frequent eye blinking, abnormal neck posturing,  Variable effort on examination, with distraction, his abnormal movement much improved,  This happened in the setting of long history of mood disorder, increased anxiety,  Emphasized the importance of better control of his mood disorder,  He would like to proceed more comprehensive examination to rule out structural abnormality, proceed with MRI of the brain and cervical spine  Trial of low-dose EMG guided Xeomin injection, prior authorization of 50 units, return to clinic in 1 month  for injection  DIAGNOSTIC DATA (LABS, IMAGING, TESTING) - I reviewed patient records, labs, notes, testing and imaging myself where available.   MEDICAL HISTORY:  Lee Grant is a 59 year old male, seen in request by his primary care from [place] NP   Marvene Barter for evaluation of light sensitivity, uncontrolled eyes blinking, abnormal neck posturing, initial evaluation was on August 09, 2024  History is obtained from the patient and review of electronic medical records. I personally reviewed pertinent available imaging films in PACS.   PMHx of  HLD HTN Depression, anxiety OCD  He had a long history of mood disorder carried a diagnosis of depression, anxiety, OCD, reported COVID symptoms in July 2025, not severe upper respiratory symptoms, following that, he had some eye allergic reaction, then began to start frequent blinking since September 2025, difficulty focusing, frequent eye blinking to the point of blocking his vision, this has caused increased depression, anxiety on him.  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  PHYSICAL EXAMNIATION:  Gen: NAD, conversant, well nourised, well groomed                     Cardiovascular: Regular rate rhythm, no peripheral edema, warm, nontender. Eyes: Conjunctivae clear without exudates or hemorrhage Neck: Supple, no carotid bruits. Pulmonary: Clear to auscultation bilaterally   NEUROLOGICAL EXAM:  MENTAL STATUS: Speech/cognition: Frequent eye blinking, have to use his finger to poke open his eyes, neck flexion, but with distraction, he was able to keep his eyes open without frequent blinking,  CRANIAL NERVES: CN II: Visual fields are full to confrontation. Pupils are round equal and briskly reactive to light. CN III, IV, VI: extraocular movement are normal. No ptosis. CN V: Facial sensation is intact to light touch CN VII: Face is symmetric with normal eye closure  CN VIII: Hearing is  normal to causal conversation. CN IX, X: Phonation is normal. CN XI: Head turning and shoulder shrug are intact  MOTOR: There is no pronator drift of out-stretched arms. Muscle bulk and tone are normal. Muscle strength is normal.  REFLEXES: Reflexes are 2+ and symmetric at the biceps, triceps, knees, and ankles. Plantar responses are  flexor.  SENSORY: Intact to light touch, pinprick and vibratory sensation are intact in fingers and toes.  COORDINATION: There is no trunk or limb dysmetria noted.  GAIT/STANCE: Posture is normal. Gait is steady   REVIEW OF SYSTEMS:  Full 14 system review of systems performed and notable only for as above All other review of systems were negative.   ALLERGIES: Allergies[1]  HOME MEDICATIONS: Current Outpatient Medications  Medication Sig Dispense Refill   atorvastatin  (LIPITOR) 10 MG tablet TAKE 1 TABLET(10 MG) BY MOUTH DAILY 90 tablet 0   clonazePAM (KLONOPIN) 0.5 MG tablet Take 0.25 mg by mouth at bedtime.     levocetirizine (XYZAL) 5 MG tablet Take 5 mg by mouth every evening.     losartan -hydrochlorothiazide (HYZAAR) 50-12.5 MG tablet Take 1 tablet by mouth daily. 90 tablet 3   metoprolol  succinate (TOPROL -XL) 25 MG 24 hr tablet Take 1 tablet (25 mg total) by mouth daily. 15 tablet 0   mirtazapine (REMERON) 15 MG tablet Take 15 mg by mouth at bedtime.     montelukast (SINGULAIR) 10 MG tablet Take 10 mg by mouth daily.     sertraline (ZOLOFT) 50 MG tablet Take 50 mg by mouth at bedtime.     buPROPion (WELLBUTRIN XL) 300 MG 24 hr tablet Take 300 mg by mouth every morning. (Patient not taking: Reported on 08/09/2024)     No current facility-administered medications for this visit.    PAST MEDICAL HISTORY: Past Medical History:  Diagnosis Date   Aortic root dilatation    Bicuspid aortic valve    Hypertension     PAST SURGICAL HISTORY: History reviewed. No pertinent surgical history.  FAMILY HISTORY: Family History  Problem Relation Age of Onset   Hypotension Mother    Aneurysm Mother    Aneurysm Father    Healthy Sister    Other Brother        bicuspid aortic valve    SOCIAL HISTORY: Social History   Socioeconomic History   Marital status: Married    Spouse name: Not on file   Number of children: Not on file   Years of education: Not on file   Highest  education level: Not on file  Occupational History   Not on file  Tobacco Use   Smoking status: Former   Smokeless tobacco: Never  Vaping Use   Vaping status: Never Used  Substance and Sexual Activity   Alcohol use: Yes    Comment: occ   Drug use: Never   Sexual activity: Not on file  Other Topics Concern   Not on file  Social History Narrative   Not on file   Social Drivers of Health   Tobacco Use: Medium Risk (08/09/2024)   Patient History    Smoking Tobacco Use: Former    Smokeless Tobacco Use: Never    Passive Exposure: Not on Actuary Strain: Not on file  Food Insecurity: Not on file  Transportation Needs: Not on file  Physical Activity: Not on file  Stress: Not on file  Social Connections: Not on file  Intimate Partner Violence: Not on file  Depression (EYV7-0): Not on file  Alcohol  Screen: Not on file  Housing: Not on file  Utilities: Not on file  Health Literacy: Not on file      Modena Callander, M.D. Ph.D.  [facility] [address] [place], [place] [address] Ph: [phone] Fax: [phone]  CC:  Marvene Prentice SAUNDERS, FNP [address] [place],  [place] [address]  Marvene Prentice SAUNDERS, FNP       [1]  Allergies Allergen Reactions   Quinolones     Patient was warned about not using Cipro and similar antibiotics. Recent studies have raised concern that fluoroquinolone antibiotics could be associated with an increased risk of aortic aneurysm Fluoroquinolones have non-antimicrobial properties that might jeopardise the integrity of the extracellular matrix of the vascular wall In a  propensity score matched cohort study in [place], there was a 66% increased rate of aortic aneurysm or dissection associated with oral fluoroquinolone use, compared wit   "

## 2024-08-09 NOTE — Telephone Encounter (Signed)
 Submitted PA request via CMM, status is pending. Key: [id number]

## 2024-08-09 NOTE — Telephone Encounter (Addendum)
 Lee Grant

## 2024-08-14 MED ORDER — INCOBOTULINUMTOXINA 50 UNITS IM SOLR: 50.0000 [IU] | INTRAMUSCULAR | 2 refills | Status: AC

## 2024-08-14 NOTE — Addendum Note (Signed)
 Addended by: DOUGLASS DELON CROME on: 08/14/2024 10:16 AM   Modules accepted: Orders

## 2024-08-14 NOTE — Telephone Encounter (Signed)
Rx sent to the below pharmacy.

## 2024-08-14 NOTE — Telephone Encounter (Signed)
 Auth was approved, please send rx for Xeomin 50u to [facility].  Auth#: [id number] (08/14/24-08/14/25)

## 2024-08-22 ENCOUNTER — Other Ambulatory Visit

## 2024-09-05 ENCOUNTER — Ambulatory Visit: Admitting: Neurology
